# Patient Record
Sex: Female | Born: 1985 | Race: Black or African American | Hispanic: No | Marital: Single | Smoking: Never smoker
Health system: Southern US, Community
[De-identification: ages and names within clinical notes are randomized; demographics above are authoritative.]

## PROBLEM LIST (undated history)

## (undated) DIAGNOSIS — T783XXA Angioneurotic edema, initial encounter: Secondary | ICD-10-CM

## (undated) DIAGNOSIS — I10 Essential (primary) hypertension: Secondary | ICD-10-CM

## (undated) DIAGNOSIS — L509 Urticaria, unspecified: Secondary | ICD-10-CM

## (undated) DIAGNOSIS — F419 Anxiety disorder, unspecified: Secondary | ICD-10-CM

## (undated) DIAGNOSIS — T7840XA Allergy, unspecified, initial encounter: Secondary | ICD-10-CM

## (undated) DIAGNOSIS — IMO0002 Reserved for concepts with insufficient information to code with codable children: Secondary | ICD-10-CM

## (undated) DIAGNOSIS — R87619 Unspecified abnormal cytological findings in specimens from cervix uteri: Secondary | ICD-10-CM

## (undated) DIAGNOSIS — J45909 Unspecified asthma, uncomplicated: Secondary | ICD-10-CM

## (undated) DIAGNOSIS — N83209 Unspecified ovarian cyst, unspecified side: Secondary | ICD-10-CM

## (undated) DIAGNOSIS — L309 Dermatitis, unspecified: Secondary | ICD-10-CM

## (undated) HISTORY — DX: Essential (primary) hypertension: I10

## (undated) HISTORY — DX: Allergy, unspecified, initial encounter: T78.40XA

## (undated) HISTORY — DX: Unspecified asthma, uncomplicated: J45.909

## (undated) HISTORY — PX: COLPOSCOPY: SHX161

## (undated) HISTORY — DX: Unspecified abnormal cytological findings in specimens from cervix uteri: R87.619

## (undated) HISTORY — DX: Unspecified ovarian cyst, unspecified side: N83.209

## (undated) HISTORY — DX: Urticaria, unspecified: L50.9

## (undated) HISTORY — DX: Anxiety disorder, unspecified: F41.9

## (undated) HISTORY — PX: NO PAST SURGERIES: SHX2092

## (undated) HISTORY — DX: Dermatitis, unspecified: L30.9

## (undated) HISTORY — DX: Angioneurotic edema, initial encounter: T78.3XXA

## (undated) HISTORY — DX: Reserved for concepts with insufficient information to code with codable children: IMO0002

---
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
## 2005-11-26 ENCOUNTER — Other Ambulatory Visit: Admission: RE | Admit: 2005-11-26 | Discharge: 2005-11-26 | Payer: Self-pay | Admitting: *Deleted

## 2006-12-26 ENCOUNTER — Other Ambulatory Visit: Admission: RE | Admit: 2006-12-26 | Discharge: 2006-12-26 | Payer: Self-pay | Admitting: *Deleted

## 2007-01-29 ENCOUNTER — Other Ambulatory Visit: Admission: RE | Admit: 2007-01-29 | Discharge: 2007-01-29 | Payer: Self-pay | Admitting: *Deleted

## 2008-01-01 ENCOUNTER — Other Ambulatory Visit: Admission: RE | Admit: 2008-01-01 | Discharge: 2008-01-01 | Payer: Self-pay | Admitting: Gynecology

## 2011-04-25 ENCOUNTER — Ambulatory Visit (INDEPENDENT_AMBULATORY_CARE_PROVIDER_SITE_OTHER): Payer: BC Managed Care – PPO | Admitting: Family Medicine

## 2011-04-25 ENCOUNTER — Ambulatory Visit: Payer: BC Managed Care – PPO

## 2011-04-25 DIAGNOSIS — R200 Anesthesia of skin: Secondary | ICD-10-CM

## 2011-04-25 DIAGNOSIS — R42 Dizziness and giddiness: Secondary | ICD-10-CM

## 2011-04-25 DIAGNOSIS — R51 Headache: Secondary | ICD-10-CM

## 2011-04-25 DIAGNOSIS — R209 Unspecified disturbances of skin sensation: Secondary | ICD-10-CM

## 2011-04-25 DIAGNOSIS — H539 Unspecified visual disturbance: Secondary | ICD-10-CM

## 2011-04-25 DIAGNOSIS — R079 Chest pain, unspecified: Secondary | ICD-10-CM

## 2011-04-25 LAB — POCT CBC
Granulocyte percent: 70.9 % (ref 37–80)
HCT, POC: 41 % (ref 37.7–47.9)
Hemoglobin: 13.1 g/dL (ref 12.2–16.2)
Lymph, poc: 1.1 (ref 0.6–3.4)
MCH, POC: 28.5 pg (ref 27–31.2)
MCHC: 32 g/dL (ref 31.8–35.4)
MCV: 89.2 fL (ref 80–97)
MID (cbc): 0.3 (ref 0–0.9)
MPV: 9.2 fL (ref 0–99.8)
POC Granulocyte: 3.6 (ref 2–6.9)
POC LYMPH PERCENT: 22.5 % (ref 10–50)
POC MID %: 6.6 %M (ref 0–12)
Platelet Count, POC: 287 10*3/uL (ref 142–424)
RBC: 4.6 M/uL (ref 4.04–5.48)
RDW, POC: 14 %
WBC: 5.1 10*3/uL (ref 4.6–10.2)

## 2011-04-25 MED ORDER — KETOROLAC TROMETHAMINE 30 MG/ML IJ SOLN
30.0000 mg | Freq: Once | INTRAMUSCULAR | Status: AC
  Administered 2011-04-25: 30 mg via INTRAVENOUS

## 2011-04-25 NOTE — Progress Notes (Signed)
[facility]:  Office Visit  Chief Complaint:  Chief Complaint  Patient presents with  . Dizziness    since Monday  . Numbness    and stiffness- Rt side of body- since Monday  . Blurred Vision    since Monday  . Headache    since Monday    HPI: Destiny Simpson is a 26 y.o. female who complains of 3 day history of right side numbness and tingling starting from neck down, then was dizzy getting up and associated with right sided HA. Associated with blurred vision. + sharp intermittent midsternal chest pain. These sxs lasted 4 pm-7 pm and then intermittently. She comes in today because the sxs have returned. She did not take anything for these sxs since Monday since she does not "swallow pills". NO risk factors. Denies HTN, DM, XOL, premature MIs in Family hx, or aneurysms and strokes.  She denies diaphoresis, palpitations, SOB/wheezing. She is on a progesterone IUD ( mirenea)  She is a Haematologist. Does not do a lot of repetitive motion with computers, the screen is at eye level   Past Medical History  Diagnosis Date  . Ovarian cyst   . Abnormal Pap smear    Past Surgical History  Procedure Date  . Colposcopy    History   Social History  . Marital Status: Single    Spouse Name: N/A    Number of Children: N/A  . Years of Education: N/A   Social History Main Topics  . Smoking status: Never Smoker   . Smokeless tobacco: None  . Alcohol Use: No  . Drug Use: No  . Sexually Active: Yes    Birth Control/ Protection: IUD   Other Topics Concern  . None   Social History Narrative  . None   Family History  Problem Relation Age of Onset  . Hypertension Maternal Grandmother   . Diabetes Maternal Grandmother   . Cancer Maternal Grandmother   . Cancer Paternal Grandmother   . Hypertension Paternal Grandfather    No Known Allergies Prior to Admission medications   Medication Sig Start Date End Date Taking? Authorizing Provider  levonorgestrel (MIRENA)  20 MCG/24HR IUD 1 each by Intrauterine route once.   Yes Historical Provider, MD     ROS: The patient denies fevers, chills, night sweats, unintentional weight loss, palpitations, wheezing, dyspnea on exertion, nausea, vomiting, abdominal pain, dysuria, hematuria, melena, numbness, weakness, or tingling.+ chest pain, dizziness, numbness/tingling, headaches All other systems have been reviewed and were otherwise negative with the exception of those mentioned in the HPI and as above.    PHYSICAL EXAM: Filed Vitals:   04/25/11 1902  BP: 111/74  Pulse: 87  Temp: 99.2 F (37.3 C)  Resp: 16   Filed Vitals:   04/25/11 1902  Height: 5' 1.5" (1.562 m)  Weight: 111 lb (50.349 kg)   Body mass index is 20.63 kg/(m^2).  General: Alert, no acute distress HEENT:  Normocephalic, atraumatic, oropharynx patent. EIMI, PERRLA, fundoscopic exam normal Cardiovascular:  Regular rate and rhythm, no rubs murmurs or gallops.  No Carotid bruits, radial pulse intact. No pedal edema.  Respiratory: Clear to auscultation bilaterally.  No wheezes, rales, or rhonchi.  No cyanosis, no use of accessory musculature GI: No organomegaly, abdomen is soft and non-tender, positive bowel sounds.  No masses. Skin: No rashes. Neurologic: Facial musculature symmetric.CN 2-12 grossly intact. 5/5 strength, Sensation intact. Cerebelllar fxn intact, romberg nl, DTR nl 2/2  Psychiatric: Patient is appropriate  throughout our interaction. Lymphatic: No cervical lymphadenopathy Musculoskeletal: Gait intact. C-spine: no tenderness, full AROM, PROM, 5/5 strangth, + spurling to the right   LABS: Results for orders placed in visit on 04/25/11  POCT CBC      Component Value Range   WBC 5.1  4.6 - 10.2 (K/uL)   Lymph, poc 1.1  0.6 - 3.4    POC LYMPH PERCENT 22.5  10 - 50 (%L)   MID (cbc) 0.3  0 - 0.9    POC MID % 6.6  0 - 12 (%M)   POC Granulocyte 3.6  2 - 6.9    Granulocyte percent 70.9  37 - 80 (%G)   RBC 4.60  4.04 - 5.48  (M/uL)   Hemoglobin 13.1  12.2 - 16.2 (g/dL)   HCT, POC 16.1  09.6 - 47.9 (%)   MCV 89.2  80 - 97 (fL)   MCH, POC 28.5  27 - 31.2 (pg)   MCHC 32.0  31.8 - 35.4 (g/dL)   RDW, POC 04.5     Platelet Count, POC 287  142 - 424 (K/uL)   MPV 9.2  0 - 99.8 (fL)     EKG/XRAY:   Primary read interpreted by Dr. Conley Rolls at [facility].  1. NSR at 74 bpm. NO ST/T wave changes 2. C-spine is normal. NO dislocation, fractures, or stenosis appreciated 3. Vision is  R eye with 20/40, L 20/20   ASSESSMENT/PLAN: Encounter Diagnoses  Name Primary?  . Chest pain Yes  . Numbness and tingling of right arm and leg   . Vision changes    Most likely an atypical migraine vs ? c-spine radicular pain since reproducible. However patient denies any history of migraine or h/o neck issues prior to this sudden onset. Will await other labs. Toradol 30 mg IM given in office sicne patient can't swallow pills to help with HA. Will get CT head without contrast to rule out CVA, mass, etc.    Brendi Mccarroll PHUONG, DO 04/25/2011 8:35 PM

## 2011-04-26 LAB — COMPREHENSIVE METABOLIC PANEL WITH GFR
Albumin: 4.4 g/dL (ref 3.5–5.2)
Alkaline Phosphatase: 66 U/L (ref 39–117)
BUN: 15 mg/dL (ref 6–23)
CO2: 27 meq/L (ref 19–32)
Calcium: 9.2 mg/dL (ref 8.4–10.5)
Chloride: 105 meq/L (ref 96–112)
Glucose, Bld: 87 mg/dL (ref 70–99)
Potassium: 4.2 meq/L (ref 3.5–5.3)
Sodium: 140 meq/L (ref 135–145)
Total Protein: 6.8 g/dL (ref 6.0–8.3)

## 2011-04-26 LAB — COMPREHENSIVE METABOLIC PANEL
ALT: 8 U/L (ref 0–35)
AST: 13 U/L (ref 0–37)
Creat: 0.81 mg/dL (ref 0.50–1.10)
Total Bilirubin: 0.6 mg/dL (ref 0.3–1.2)

## 2011-04-26 LAB — TSH: TSH: 1.035 u[IU]/mL (ref 0.350–4.500)

## 2011-04-27 ENCOUNTER — Ambulatory Visit (INDEPENDENT_AMBULATORY_CARE_PROVIDER_SITE_OTHER): Payer: BC Managed Care – PPO | Admitting: Internal Medicine

## 2011-04-27 VITALS — BP 96/63 | HR 96 | Temp 99.3°F | Resp 18 | Ht 61.0 in | Wt 111.4 lb

## 2011-04-27 DIAGNOSIS — T782XXA Anaphylactic shock, unspecified, initial encounter: Secondary | ICD-10-CM

## 2011-04-27 DIAGNOSIS — L509 Urticaria, unspecified: Secondary | ICD-10-CM

## 2011-04-27 DIAGNOSIS — T7840XA Allergy, unspecified, initial encounter: Secondary | ICD-10-CM

## 2011-04-27 MED ORDER — PREDNISOLONE 15 MG/5ML PO SYRP: 1.0000 mg/kg | ORAL_SOLUTION | Freq: Every day | ORAL | Status: DC

## 2011-04-27 MED ORDER — DIPHENHYDRAMINE HCL 12.5 MG/5ML PO SYRP: 50.0000 mg | ORAL_SOLUTION | Freq: Four times a day (QID) | ORAL | Status: AC | PRN

## 2011-04-27 NOTE — Progress Notes (Signed)
  Subjective:    Patient ID: Destiny Simpson, female    DOB: 01/26/1986, 26 y.o.   MRN: [id number]  Urticaria   Hives Onset today at 2:30 No difficulty breathing or swallowing No sob After she took a dose of peto bismol No know allergy to aspirin   Review of Systems  Unable to perform ROS Constitutional: Negative.   HENT: Negative.   Eyes: Negative.   Respiratory: Negative.   Cardiovascular: Negative.   Gastrointestinal: Negative.   Genitourinary: Negative.   Skin: Positive for rash.  Neurological: Negative.   Hematological: Negative.   Psychiatric/Behavioral: Negative.   All other systems reviewed and are negative.       Objective:   Physical Exam  Nursing note and vitals reviewed. Constitutional: She is oriented to person, place, and time. She appears well-developed and well-nourished.  HENT:  Head: Normocephalic and atraumatic.  Right Ear: External ear normal.  Left Ear: External ear normal.  Eyes: Conjunctivae and EOM are normal. Pupils are equal, round, and reactive to light.  Neck: Normal range of motion. Neck supple.  Cardiovascular: Normal rate, regular rhythm and normal heart sounds.   Pulmonary/Chest: Effort normal and breath sounds normal.  Abdominal: Soft. Bowel sounds are normal.  Musculoskeletal: Normal range of motion.  Neurological: She is alert and oriented to person, place, and time. She has normal reflexes.  Skin: Rash noted.       Hives on upper extrems  Psychiatric: She has a normal mood and affect. Her behavior is normal. Judgment and thought content normal.          Assessment & Plan:  Allergic reaction. Antihistamines prednisone avoidance of allergin

## 2011-04-27 NOTE — Patient Instructions (Signed)
Benadryl 50 mg every 6 hours fo r 3 days.prelone as directed daily for 3 days. No pepto bismol or aspirin as you may be allergic to this. If your symptoms worsen or if you develop difficulty swallowing or breathing return to the er.

## 2011-08-07 ENCOUNTER — Ambulatory Visit (INDEPENDENT_AMBULATORY_CARE_PROVIDER_SITE_OTHER): Payer: BC Managed Care – PPO | Admitting: Family Medicine

## 2011-08-07 VITALS — BP 110/77 | HR 61 | Temp 98.5°F | Resp 16 | Ht 60.25 in | Wt 115.0 lb

## 2011-08-07 DIAGNOSIS — L559 Sunburn, unspecified: Secondary | ICD-10-CM

## 2011-08-07 DIAGNOSIS — L259 Unspecified contact dermatitis, unspecified cause: Secondary | ICD-10-CM

## 2011-08-07 DIAGNOSIS — Z888 Allergy status to other drugs, medicaments and biological substances status: Secondary | ICD-10-CM

## 2011-08-07 DIAGNOSIS — L309 Dermatitis, unspecified: Secondary | ICD-10-CM

## 2011-08-07 DIAGNOSIS — L509 Urticaria, unspecified: Secondary | ICD-10-CM

## 2011-08-07 DIAGNOSIS — T7840XA Allergy, unspecified, initial encounter: Secondary | ICD-10-CM

## 2011-08-07 MED ORDER — PREDNISOLONE 15 MG/5ML PO SYRP: 1.0000 mg/kg | ORAL_SOLUTION | Freq: Every day | ORAL | Status: AC

## 2011-08-07 MED ORDER — SILVER SULFADIAZINE 1 % EX CREA: TOPICAL_CREAM | Freq: Every day | CUTANEOUS | Status: DC

## 2011-08-07 NOTE — Progress Notes (Signed)
  Subjective:    Patient ID: Destiny Simpson, female    DOB: [birth year], 26 y.o.   MRN: [id number]  HPI Sunburn and eczema flare x 4 days.  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  Has had worsening skin tenderness  Baseline hx/o eczema.  Has had flare of this over the same time period.  Gwenith Daily been using topical lotions and OTC hydrocortisone cream with minimal improvement in sxs.    Review of Systems See HPI, otherwise ROS negative     Objective:   Physical Exam  Constitutional: She appears well-developed and well-nourished.  HENT:  Head: Normocephalic.  Right Ear: External ear normal.  Eyes: Pupils are equal, round, and reactive to light.  Neck: Normal range of motion. Neck supple.  Cardiovascular: Normal rate and regular rhythm.   Pulmonary/Chest: Effort normal and breath sounds normal.  Skin:          + hyperpigmentation and peeling on shoulders bilaterally.           Assessment & Plan:  Treat sunburn with silvadene Treat eczema flare with prednisone.  Discussed general red flags and skin care/sun avoidance.  Handout given.     The patient and/or caregiver has been counseled thoroughly with regard to treatment plan and/or medications prescribed including dosage, schedule, interactions, rationale for use, and possible side effects and they verbalize understanding. Diagnoses and expected course of recovery discussed and will return if not improved as expected or if the condition worsens. Patient and/or caregiver verbalized understanding.

## 2011-08-07 NOTE — Patient Instructions (Signed)
Sunburn Sunburn is damage to the skin caused by overexposure to ultraviolet (UV) rays. People with light skin or a fair complexion may be more susceptible to sunburn. Repeated sun exposure causes early skin aging such as wrinkles and sun spots. It also increases the risk of skin cancer. CAUSES A sunburn is caused by getting too much UV radiation from the sun. SYMPTOMS  Red or pink skin.   Soreness and swelling.   Pain.   Blisters.   Peeling skin.   Headache, fever, and fatigue if sunburn covers a large area.  TREATMENT  Your caregiver may tell you to take certain medicines to lessen inflammation.   Your caregiver may have you use hydrocortisone cream or spray to help with itching and inflammation.   Your caregiver may prescribe an antibiotic cream to use on blisters.  HOME CARE INSTRUCTIONS   Avoid further exposure to the sun.   Cool baths and cool compresses may be helpful if used several times per day. Do not apply ice, since this may result in more damage to the skin.   Only take over-the-counter or prescription medicines for pain, discomfort, or fever as directed by your caregiver.   Use aloe or other over-the-counter sunburn creams or gels on your skin. Do not apply these creams or gels on blisters.   Drink enough fluids to keep your urine clear or pale yellow.   Do not break blisters. If blisters break, your caregiver may recommend an antibiotic cream to apply to the affected area.  PREVENTION   Try to avoid the sun between 10:00 a.m. and 4:00 p.m. when it is the strongest.   Use a sunscreen or sunblock with SPF 30 or greater.   Apply sunscreen at least 30 minutes before exposure to the sun.   Always wear protective hats, clothing, and sunglasses with UV protection.   Avoid medicines, herbs, and foods that increase your sensitivity to sunlight.   Avoid tanning beds.  SEEK IMMEDIATE MEDICAL CARE IF:   You have a fever.   Your pain is uncontrolled with  medicine.   You start to vomit or have diarrhea.   You feel faint or develop a headache with confusion.   You develop severe blistering.   You have a pus-like (purulent) discharge coming from the blisters.   Your burn becomes more painful and swollen.  MAKE SURE YOU:  Understand these instructions.   Will watch your condition.   Will get help right away if you are not doing well or get worse.  Document Released: 11/08/2004 Document Revised: 01/18/2011 Document Reviewed: 07/23/2010 [facility] Patient Information 2012 [place], [place]. Eczema Atopic dermatitis, or eczema, is an inherited type of sensitive skin. Often people with eczema have a family history of allergies, asthma, or hay fever. It causes a red itchy rash and dry scaly skin. The itchiness may occur before the skin rash and may be very intense. It is not contagious. Eczema is generally worse during the cooler winter months and often improves with the warmth of summer. Eczema usually starts showing signs in infancy. Some children outgrow eczema, but it may last through adulthood. Flare-ups may be caused by:  Eating something or contact with something you are sensitive or allergic to.   Stress.  DIAGNOSIS  The diagnosis of eczema is usually based upon symptoms and medical history. TREATMENT  Eczema cannot be cured, but symptoms usually can be controlled with treatment or avoidance of allergens (things to which you are sensitive or allergic to).  Controlling the itching and scratching.   Use over-the-counter antihistamines as directed for itching. It is especially useful at night when the itching tends to be worse.   Use over-the-counter steroid creams as directed for itching.   Scratching makes the rash and itching worse and may cause impetigo (a skin infection) if fingernails are contaminated (dirty).   Keeping the skin well moisturized with creams every day. This will seal in moisture and help prevent dryness. Lotions  containing alcohol and water can dry the skin and are not recommended.   Limiting exposure to allergens.   Recognizing situations that cause stress.   Developing a plan to manage stress.  HOME CARE INSTRUCTIONS   Take prescription and over-the-counter medicines as directed by your caregiver.   Do not use anything on the skin without checking with your caregiver.   Keep baths or showers short (5 minutes) in warm (not hot) water. Use mild cleansers for bathing. You may add non-perfumed bath oil to the bath water. It is best to avoid soap and bubble bath.   Immediately after a bath or shower, when the skin is still damp, apply a moisturizing ointment to the entire body. This ointment should be a petroleum ointment. This will seal in moisture and help prevent dryness. The thicker the ointment the better. These should be unscented.   Keep fingernails cut short and wash hands often. If your child has eczema, it may be necessary to put soft gloves or mittens on your child at night.   Dress in clothes made of cotton or cotton blends. Dress lightly, as heat increases itching.   Avoid foods that may cause flare-ups. Common foods include cow's milk, peanut butter, eggs and wheat.   Keep a child with eczema away from anyone with fever blisters. The virus that causes fever blisters (herpes simplex) can cause a serious skin infection in children with eczema.  SEEK MEDICAL CARE IF:   Itching interferes with sleep.   The rash gets worse or is not better within one week following treatment.   The rash looks infected (pus or soft yellow scabs).   You or your child has an oral temperature above 102 F (38.9 C).   Your baby is older than 3 months with a rectal temperature of 100.5 F (38.1 C) or higher for more than 1 day.   The rash flares up after contact with someone who has fever blisters.  SEEK IMMEDIATE MEDICAL CARE IF:   Your baby is older than 3 months with a rectal temperature of 102 F  (38.9 C) or higher.   Your baby is older than 3 months or younger with a rectal temperature of 100.4 F (38 C) or higher.  Document Released: 01/27/2000 Document Revised: 01/18/2011 Document Reviewed: 12/01/2008 [facility] Patient Information 2012 [place], [place].

## 2011-11-03 ENCOUNTER — Encounter: Payer: Self-pay | Admitting: Family Medicine

## 2011-11-03 ENCOUNTER — Ambulatory Visit (INDEPENDENT_AMBULATORY_CARE_PROVIDER_SITE_OTHER): Payer: BC Managed Care – PPO | Admitting: Family Medicine

## 2011-11-03 VITALS — BP 100/70 | HR 90 | Temp 98.8°F | Resp 16 | Ht 62.0 in | Wt 116.8 lb

## 2011-11-03 DIAGNOSIS — R339 Retention of urine, unspecified: Secondary | ICD-10-CM

## 2011-11-03 DIAGNOSIS — N390 Urinary tract infection, site not specified: Secondary | ICD-10-CM

## 2011-11-03 DIAGNOSIS — L309 Dermatitis, unspecified: Secondary | ICD-10-CM

## 2011-11-03 DIAGNOSIS — R300 Dysuria: Secondary | ICD-10-CM

## 2011-11-03 LAB — POCT URINALYSIS DIPSTICK
Bilirubin, UA: NEGATIVE
Glucose, UA: NEGATIVE
Leukocytes, UA: NEGATIVE
Nitrite, UA: NEGATIVE
Urobilinogen, UA: 0.2

## 2011-11-03 LAB — POCT UA - MICROSCOPIC ONLY: Crystals, Ur, HPF, POC: NEGATIVE

## 2011-11-03 MED ORDER — BETAMETHASONE DIPROPIONATE 0.05 % EX CREA: TOPICAL_CREAM | Freq: Two times a day (BID) | CUTANEOUS | Status: DC

## 2011-11-03 MED ORDER — CEPHALEXIN 250 MG/5ML PO SUSR: 250.0000 mg | Freq: Three times a day (TID) | ORAL | Status: DC

## 2011-11-03 NOTE — Progress Notes (Signed)
@[email or web address] @   Patient ID: Destiny Simpson MRN: [id number], DOB: October 15, 1985, 26 y.o. Date of Encounter: 11/03/2011, 10:16 AM  Primary Physician: No primary provider on file.  Chief Complaint:  Chief Complaint  Patient presents with  . Rash    on right arm and back of legs  . cannot hardly go urinate    barely comes out    HPI: 26 y.o. year old female presents with 5 day history of dysuria, urgency, and frequency. Last UTI was July No hematuria LMP:  No sick contacts, recent antibiotics, or recent travels.   No vaginal discharge, back pain, fever  Past Medical History  Diagnosis Date  . Ovarian cyst   . Abnormal Pap smear      Home Meds: Prior to Admission medications   Medication Sig Start Date End Date Taking? Authorizing Provider  levonorgestrel (MIRENA) 20 MCG/24HR IUD 1 each by Intrauterine route once.   Yes Historical Provider, MD  silver sulfADIAZINE (SILVADENE) 1 % cream Apply topically daily. Apply to shoulders untils symptoms resolve 08/07/11 08/06/12  Doree Albee, MD    Allergies: No Known Allergies  History   Social History  . Marital Status: Single    Spouse Name: N/A    Number of Children: N/A  . Years of Education: N/A   Occupational History  . Not on file.   Social History Main Topics  . Smoking status: Never Smoker   . Smokeless tobacco: Not on file  . Alcohol Use: No  . Drug Use: No  . Sexually Active: Yes    Birth Control/ Protection: IUD   Other Topics Concern  . Not on file   Social History Narrative  . No narrative on file     Review of Systems: Constitutional: negative for chills, fever, night sweats or weight changes Cardiovascular: negative for chest pain or palpitations Respiratory: negative for hemoptysis, wheezing, or shortness of breath Abdominal: negative for abdominal pain, nausea, vomiting or diarrhea Dermatological: negative for rash Neurologic: negative for headache   Physical Exam: Blood pressure 100/70, pulse  90, temperature 98.8 F (37.1 C), temperature source Oral, resp. rate 16, height 5\' 2"  (1.575 m), weight 116 lb 12.8 oz (52.98 kg), SpO2 99.00%., Body mass index is 21.36 kg/(m^2). General: Well developed, well nourished, in no acute distress. Head: Normocephalic, atraumatic, eyes without discharge, sclera non-icteric, nares are congested. Bilateral auditory canals clear, TM's are without perforation, pearly grey with reflective cone of light bilaterally. Serous effusion bilaterally behind TM's. Maxillary sinus TTP. Oral cavity moist, dentition normal. Posterior pharynx with post nasal drip and mild erythema. No peritonsillar abscess or tonsillar exudate. Neck: Supple. No thyromegaly. Full ROM. No lymphadenopathy. Lungs: Coarse breath sounds bilaterally without Clear bilaterally to auscultation without wheezes, rales, or rhonchi. Breathing is unlabored.  Heart: RRR with S1 S2. No murmurs, rubs, or gallops appreciated. Abdomen: Soft, non-tender, non-distended with normoactive bowel sounds. No hepatosplenomegaly. No rebound/guarding. No obvious abdominal masses. McBurney's, Rovsing's, Iliopsoas, and table jar all negative. Msk:  Strength and tone normal for age. Extremities: No clubbing or cyanosis. No edema. Neuro: Alert and oriented X 3. Moves all extremities spontaneously. CNII-XII grossly in tact. Psych:  Responds to questions appropriately with a normal affect.   Labs: Results for orders placed in visit on 11/03/11  POCT URINALYSIS DIPSTICK      Component Value Range   Color, UA yellow     Clarity, UA clear     Glucose, UA neg     Bilirubin, UA neg  Ketones, UA neg     Spec Grav, UA >=1.030     Blood, UA trace     pH, UA 5.5     Protein, UA neg     Urobilinogen, UA 0.2     Nitrite, UA neg     Leukocytes, UA Negative    POCT UA - MICROSCOPIC ONLY      Component Value Range   WBC, Ur, HPF, POC 1-3     RBC, urine, microscopic 1-3     Bacteria, U Microscopic tr     Mucus, UA tr       Epithelial cells, urine per micros 3-5     Crystals, Ur, HPF, POC neg     Casts, Ur, LPF, POC neg     Yeast, UA neg        ASSESSMENT AND PLAN:  26 y.o. year old female with  - -Mucinex -Tylenol/Motrin prn -Rest/fluids -RTC precautions -RTC 3-5 days if no improvement  Also, patient  Has a right antecubital rash not responding to her cortisone or neosporin creams.  Also has a rash behind the right knee.  H/O eczema  Objective:  Eczematous rashes behind right knee and involving the right antecubital area  Assessment:  Eczema Plan:  Stop the neosporin  Signed, Elvina Sidle, MD 11/03/2011 10:16 AM

## 2011-11-03 NOTE — Patient Instructions (Addendum)
Urinary Tract Infection Infections of the urinary tract can start in several places. A bladder infection (cystitis), a kidney infection (pyelonephritis), and a prostate infection (prostatitis) are different types of urinary tract infections (UTIs). They usually get better if treated with medicines (antibiotics) that kill germs. Take all the medicine until it is gone. You or your child may feel better in a few days, but TAKE ALL MEDICINE or the infection may not respond and may become more difficult to treat. HOME CARE INSTRUCTIONS   Drink enough water and fluids to keep the urine clear or pale yellow. Cranberry juice is especially recommended, in addition to large amounts of water.   Avoid caffeine, tea, and carbonated beverages. They tend to irritate the bladder.   Alcohol may irritate the prostate.   Only take over-the-counter or prescription medicines for pain, discomfort, or fever as directed by your caregiver.  To prevent further infections:  Empty the bladder often. Avoid holding urine for long periods of time.   After a bowel movement, women should cleanse from front to back. Use each tissue only once.   Empty the bladder before and after sexual intercourse.  FINDING OUT THE RESULTS OF YOUR TEST Not all test results are available during your visit. If your or your child's test results are not back during the visit, make an appointment with your caregiver to find out the results. Do not assume everything is normal if you have not heard from your caregiver or the medical facility. It is important for you to follow up on all test results. SEEK MEDICAL CARE IF:   There is back pain.   Your baby is older than 3 months with a rectal temperature of 100.5 F (38.1 C) or higher for more than 1 day.   Your or your child's problems (symptoms) are no better in 3 days. Return sooner if you or your child is getting worse.  SEEK IMMEDIATE MEDICAL CARE IF:   There is severe back pain or lower  abdominal pain.   You or your child develops chills.   You have a fever.   Your baby is older than 3 months with a rectal temperature of 102 F (38.9 C) or higher.   Your baby is 23 months old or younger with a rectal temperature of 100.4 F (38 C) or higher.   There is nausea or vomiting.   There is continued burning or discomfort with urination.  MAKE SURE YOU:   Understand these instructions.   Will watch your condition.   Will get help right away if you are not doing well or get worse.  Document Released: 11/08/2004 Document Revised: 01/18/2011 Document Reviewed: 06/13/2006 [facility] Patient Information 2012 [place], [place].Eczema Atopic dermatitis, or eczema, is an inherited type of sensitive skin. Often people with eczema have a family history of allergies, asthma, or hay fever. It causes a red itchy rash and dry scaly skin. The itchiness may occur before the skin rash and may be very intense. It is not contagious. Eczema is generally worse during the cooler winter months and often improves with the warmth of summer. Eczema usually starts showing signs in infancy. Some children outgrow eczema, but it may last through adulthood. Flare-ups may be caused by:  Eating something or contact with something you are sensitive or allergic to.   Stress.  DIAGNOSIS  The diagnosis of eczema is usually based upon symptoms and medical history. TREATMENT  Eczema cannot be cured, but symptoms usually can be controlled with treatment  or avoidance of allergens (things to which you are sensitive or allergic to).  Controlling the itching and scratching.   Use over-the-counter antihistamines as directed for itching. It is especially useful at night when the itching tends to be worse.   Use over-the-counter steroid creams as directed for itching.   Scratching makes the rash and itching worse and may cause impetigo (a skin infection) if fingernails are contaminated (dirty).   Keeping the skin  well moisturized with creams every day. This will seal in moisture and help prevent dryness. Lotions containing alcohol and water can dry the skin and are not recommended.   Limiting exposure to allergens.   Recognizing situations that cause stress.   Developing a plan to manage stress.  HOME CARE INSTRUCTIONS   Take prescription and over-the-counter medicines as directed by your caregiver.   Do not use anything on the skin without checking with your caregiver.   Keep baths or showers short (5 minutes) in warm (not hot) water. Use mild cleansers for bathing. You may add non-perfumed bath oil to the bath water. It is best to avoid soap and bubble bath.   Immediately after a bath or shower, when the skin is still damp, apply a moisturizing ointment to the entire body. This ointment should be a petroleum ointment. This will seal in moisture and help prevent dryness. The thicker the ointment the better. These should be unscented.   Keep fingernails cut short and wash hands often. If your child has eczema, it may be necessary to put soft gloves or mittens on your child at night.   Dress in clothes made of cotton or cotton blends. Dress lightly, as heat increases itching.   Avoid foods that may cause flare-ups. Common foods include cow's milk, peanut butter, eggs and wheat.   Keep a child with eczema away from anyone with fever blisters. The virus that causes fever blisters (herpes simplex) can cause a serious skin infection in children with eczema.  SEEK MEDICAL CARE IF:   Itching interferes with sleep.   The rash gets worse or is not better within one week following treatment.   The rash looks infected (pus or soft yellow scabs).   You or your child has an oral temperature above 102 F (38.9 C).   Your baby is older than 3 months with a rectal temperature of 100.5 F (38.1 C) or higher for more than 1 day.   The rash flares up after contact with someone who has fever blisters.    SEEK IMMEDIATE MEDICAL CARE IF:   Your baby is older than 3 months with a rectal temperature of 102 F (38.9 C) or higher.   Your baby is older than 3 months or younger with a rectal temperature of 100.4 F (38 C) or higher.  Document Released: 01/27/2000 Document Revised: 01/18/2011 Document Reviewed: 12/01/2008 [facility] Patient Information 2012 [place], [place].

## 2011-11-04 LAB — URINE CULTURE
Colony Count: NO GROWTH
Organism ID, Bacteria: NO GROWTH

## 2012-01-05 ENCOUNTER — Ambulatory Visit (INDEPENDENT_AMBULATORY_CARE_PROVIDER_SITE_OTHER): Payer: BC Managed Care – PPO | Admitting: Family Medicine

## 2012-01-05 VITALS — BP 115/73 | HR 64 | Temp 97.9°F | Resp 16 | Ht 61.58 in | Wt 120.4 lb

## 2012-01-05 VITALS — BP 100/60 | HR 60 | Temp 98.0°F | Resp 16 | Ht 61.5 in | Wt 119.0 lb

## 2012-01-05 DIAGNOSIS — L509 Urticaria, unspecified: Secondary | ICD-10-CM

## 2012-01-05 DIAGNOSIS — K5900 Constipation, unspecified: Secondary | ICD-10-CM

## 2012-01-05 DIAGNOSIS — M545 Low back pain, unspecified: Secondary | ICD-10-CM

## 2012-01-05 DIAGNOSIS — R160 Hepatomegaly, not elsewhere classified: Secondary | ICD-10-CM

## 2012-01-05 DIAGNOSIS — R300 Dysuria: Secondary | ICD-10-CM

## 2012-01-05 DIAGNOSIS — N390 Urinary tract infection, site not specified: Secondary | ICD-10-CM

## 2012-01-05 DIAGNOSIS — R109 Unspecified abdominal pain: Secondary | ICD-10-CM

## 2012-01-05 LAB — POCT URINALYSIS DIPSTICK
Glucose, UA: NEGATIVE
Nitrite, UA: NEGATIVE
Urobilinogen, UA: 0.2

## 2012-01-05 LAB — POCT UA - MICROSCOPIC ONLY
Casts, Ur, LPF, POC: NEGATIVE
Yeast, UA: NEGATIVE

## 2012-01-05 MED ORDER — CEFTRIAXONE SODIUM 1 G IJ SOLR
1.0000 g | Freq: Once | INTRAMUSCULAR | Status: AC
  Administered 2012-01-05: 1 g via INTRAMUSCULAR

## 2012-01-05 MED ORDER — KETOROLAC TROMETHAMINE 60 MG/2ML IM SOLN
60.0000 mg | Freq: Once | INTRAMUSCULAR | Status: AC
  Administered 2012-01-05: 60 mg via INTRAMUSCULAR

## 2012-01-05 MED ORDER — CETIRIZINE HCL 5 MG/5ML PO SYRP: 10.0000 mg | ORAL_SOLUTION | Freq: Every day | ORAL | Status: DC

## 2012-01-05 MED ORDER — CEPHALEXIN 250 MG/5ML PO SUSR: 500.0000 mg | Freq: Two times a day (BID) | ORAL | Status: AC

## 2012-01-05 NOTE — Progress Notes (Deleted)
  Subjective:    Patient ID: Destiny Simpson, female    DOB: [birth year], 26 y.o.   MRN: [id number]  HPI    Review of Systems     Objective:   Physical Exam        Assessment & Plan:

## 2012-01-05 NOTE — Patient Instructions (Signed)
Start a daily fiber supplement like benefiber or metamucil.  If you still have constipation add in miralax (polyethylene glycol 3350) once to twice a day and/or docusate (colace) 100mg  twice a day.  Constipation, Adult Constipation is when a person has fewer than 3 bowel movements a week; has difficulty having a bowel movement; or has stools that are dry, hard, or larger than normal. As people grow older, constipation is more common. If you try to fix constipation with medicines that make you have a bowel movement (laxatives), the problem may get worse. Long-term laxative use may cause the muscles of the colon to become weak. A low-fiber diet, not taking in enough fluids, and taking certain medicines may make constipation worse. CAUSES   Certain medicines, such as antidepressants, pain medicine, iron supplements, antacids, and water pills.   Certain diseases, such as diabetes, irritable bowel syndrome (IBS), thyroid disease, or depression.   Not drinking enough water.   Not eating enough fiber-rich foods.   Stress or travel.  Lack of physical activity or exercise.  Not going to the restroom when there is the urge to have a bowel movement.  Ignoring the urge to have a bowel movement.  Using laxatives too much. SYMPTOMS   Having fewer than 3 bowel movements a week.   Straining to have a bowel movement.   Having hard, dry, or larger than normal stools.   Feeling full or bloated.   Pain in the lower abdomen.  Not feeling relief after having a bowel movement. DIAGNOSIS  Your caregiver will take a medical history and perform a physical exam. Further testing may be done for severe constipation. Some tests may include:   A barium enema X-ray to examine your rectum, colon, and sometimes, your small intestine.  A sigmoidoscopy to examine your lower colon.  A colonoscopy to examine your entire colon. TREATMENT  Treatment will depend on the severity of your constipation and  what is causing it. Some dietary treatments include drinking more fluids and eating more fiber-rich foods. Lifestyle treatments may include regular exercise. If these diet and lifestyle recommendations do not help, your caregiver may recommend taking over-the-counter laxative medicines to help you have bowel movements. Prescription medicines may be prescribed if over-the-counter medicines do not work.  HOME CARE INSTRUCTIONS   Increase dietary fiber in your diet, such as fruits, vegetables, whole grains, and beans. Limit high-fat and processed sugars in your diet, such as [place] fries, hamburgers, cookies, candies, and soda.   A fiber supplement may be added to your diet if you cannot get enough fiber from foods.   Drink enough fluids to keep your urine clear or pale yellow.   Exercise regularly or as directed by your caregiver.   Go to the restroom when you have the urge to go. Do not hold it.  Only take medicines as directed by your caregiver. Do not take other medicines for constipation without talking to your caregiver first. SEEK IMMEDIATE MEDICAL CARE IF:   You have bright red blood in your stool.   Your constipation lasts for more than 4 days or gets worse.   You have abdominal or rectal pain.   You have thin, pencil-like stools.  You have unexplained weight loss. MAKE SURE YOU:   Understand these instructions.  Will watch your condition.  Will get help right away if you are not doing well or get worse. Document Released: 10/28/2003 Document Revised: 04/23/2011 Document Reviewed: 01/02/2011 [facility] Patient Information 2013 [place], [place].

## 2012-01-05 NOTE — Progress Notes (Signed)
Subjective:    Patient ID: Destiny Simpson, female    DOB: 05-Apr-1985, 26 y.o.   MRN: [id number] Chief Complaint  Patient presents with  . Urinary Tract Infection    x 1.5 days   burning,  lbp, abd pain, frequency, retention    HPI  Sxs started 2d ago and low back pain yesterday. Can't swallow pills.  Past Medical History  Diagnosis Date  . Ovarian cyst   . Abnormal Pap smear   . Allergy   . Asthma    Current Outpatient Prescriptions on File Prior to Visit  Medication Sig Dispense Refill  . levonorgestrel (MIRENA) 20 MCG/24HR IUD 1 each by Intrauterine route once.       No current facility-administered medications on file prior to visit.   No Known Allergies   Review of Systems  Constitutional: Positive for chills, diaphoresis and activity change. Negative for fever, appetite change, fatigue and unexpected weight change.  Gastrointestinal: Positive for nausea and abdominal pain. Negative for diarrhea, constipation, blood in stool, anal bleeding and rectal pain.  Genitourinary: Positive for dysuria, urgency, frequency, flank pain, decreased urine volume and difficulty urinating. Negative for hematuria, vaginal bleeding, vaginal discharge, genital sores, vaginal pain, menstrual problem and pelvic pain.  Musculoskeletal: Positive for back pain. Negative for gait problem.  Skin: Negative for rash.  Hematological: Negative for adenopathy.  Psychiatric/Behavioral: The patient is not nervous/anxious.       BP 115/73  Pulse 64  Temp(Src) 97.9 F (36.6 C) (Oral)  Resp 16  Ht 5' 1.58" (1.564 m)  Wt 120 lb 6.4 oz (54.613 kg)  BMI 22.33 kg/m2  SpO2 100% Objective:   Physical Exam  Constitutional: She is oriented to person, place, and time. She appears well-developed and well-nourished. No distress.  HENT:  Head: Normocephalic and atraumatic.  Cardiovascular: Normal rate, regular rhythm, normal heart sounds and intact distal pulses.   Pulmonary/Chest: Effort normal and breath  sounds normal.  Abdominal: Soft. Bowel sounds are normal. She exhibits no distension. There is no hepatosplenomegaly. There is tenderness in the right lower quadrant, periumbilical area, suprapubic area and left lower quadrant. There is no rebound, no guarding and no CVA tenderness.  Neurological: She is alert and oriented to person, place, and time.  Skin: Skin is warm and dry. She is not diaphoretic.  Psychiatric: She has a normal mood and affect. Her behavior is normal.       Results for orders placed in visit on 01/05/12  URINE CULTURE      Result Value Range   Culture ESCHERICHIA COLI     Colony Count >=100,000 COLONIES/ML     Organism ID, Bacteria ESCHERICHIA COLI    POCT UA - MICROSCOPIC ONLY      Result Value Range   WBC, Ur, HPF, POC TNTC     RBC, urine, microscopic 10-15     Bacteria, U Microscopic 1+     Mucus, UA trace     Epithelial cells, urine per micros 3-6     Crystals, Ur, HPF, POC neg     Casts, Ur, LPF, POC neg     Yeast, UA neg    POCT URINALYSIS DIPSTICK      Result Value Range   Color, UA yellow     Clarity, UA cloudy     Glucose, UA neg     Bilirubin, UA neg     Ketones, UA neg     Spec Grav, UA >=1.030     Blood, UA  large     pH, UA 5.5     Protein, UA 100     Urobilinogen, UA 0.2     Nitrite, UA neg     Leukocytes, UA moderate (2+)      Assessment & Plan:  Lower back pain - Plan: POCT UA - Microscopic Only, POCT urinalysis dipstick  Dysuria - Plan: POCT UA - Microscopic Only, POCT urinalysis dipstick, cephALEXin (KEFLEX) 250 MG/5ML suspension  Abdominal pain - Plan: POCT UA - Microscopic Only, POCT urinalysis dipstick  Urinary tract infection - Plan: Urine culture, cephALEXin (KEFLEX) 250 MG/5ML suspension, cefTRIAXone (ROCEPHIN) injection 1 g, ketorolac (TORADOL) injection 60 mg  Constipation  Meds ordered this encounter  Medications  . cephALEXin (KEFLEX) 250 MG/5ML suspension    Sig: Take 10 mLs (500 mg total) by mouth 2 (two) times  daily.    Dispense:  150 mL    Refill:  0  . cefTRIAXone (ROCEPHIN) injection 1 g    Sig:   . ketorolac (TORADOL) injection 60 mg    Sig:

## 2012-01-08 LAB — URINE CULTURE: Colony Count: >=100000

## 2012-04-28 ENCOUNTER — Ambulatory Visit (INDEPENDENT_AMBULATORY_CARE_PROVIDER_SITE_OTHER): Payer: BC Managed Care – PPO | Admitting: Physician Assistant

## 2012-04-28 VITALS — BP 93/70 | HR 88 | Temp 97.7°F | Resp 18 | Wt 123.0 lb

## 2012-04-28 DIAGNOSIS — R300 Dysuria: Secondary | ICD-10-CM

## 2012-04-28 DIAGNOSIS — N390 Urinary tract infection, site not specified: Secondary | ICD-10-CM

## 2012-04-28 LAB — POCT URINALYSIS DIPSTICK
Nitrite, UA: NEGATIVE
Protein, UA: 100
Urobilinogen, UA: >=8
pH, UA: 8.5

## 2012-04-28 LAB — POCT UA - MICROSCOPIC ONLY
Casts, Ur, LPF, POC: NEGATIVE
Crystals, Ur, HPF, POC: NEGATIVE

## 2012-04-28 MED ORDER — CIPROFLOXACIN 500 MG/5ML (10%) PO SUSR: 500.0000 mg | Freq: Two times a day (BID) | ORAL | Status: DC

## 2012-04-28 NOTE — Patient Instructions (Addendum)
Take the antibiotic as directed.  Be sure to finish the full course.  Plenty of fluids.  Please let us know if you are worsening or not improving.   Urinary Tract Infection Urinary tract infections (UTIs) can develop anywhere along your urinary tract. Your urinary tract is your body's drainage system for removing wastes and extra water. Your urinary tract includes two kidneys, two ureters, a bladder, and a urethra. Your kidneys are a pair of bean-shaped organs. Each kidney is about the size of your fist. They are located below your ribs, one on each side of your spine. CAUSES Infections are caused by microbes, which are microscopic organisms, including fungi, viruses, and bacteria. These organisms are so small that they can only be seen through a microscope. Bacteria are the microbes that most commonly cause UTIs. SYMPTOMS  Symptoms of UTIs may vary by age and gender of the patient and by the location of the infection. Symptoms in young women typically include a frequent and intense urge to urinate and a painful, burning feeling in the bladder or urethra during urination. Older women and men are more likely to be tired, shaky, and weak and have muscle aches and abdominal pain. A fever may mean the infection is in your kidneys. Other symptoms of a kidney infection include pain in your back or sides below the ribs, nausea, and vomiting. DIAGNOSIS To diagnose a UTI, your caregiver will ask you about your symptoms. Your caregiver also will ask to provide a urine sample. The urine sample will be tested for bacteria and white blood cells. White blood cells are made by your body to help fight infection. TREATMENT  Typically, UTIs can be treated with medication. Because most UTIs are caused by a bacterial infection, they usually can be treated with the use of antibiotics. The choice of antibiotic and length of treatment depend on your symptoms and the type of bacteria causing your infection. HOME CARE  INSTRUCTIONS  If you were prescribed antibiotics, take them exactly as your caregiver instructs you. Finish the medication even if you feel better after you have only taken some of the medication.  Drink enough water and fluids to keep your urine clear or pale yellow.  Avoid caffeine, tea, and carbonated beverages. They tend to irritate your bladder.  Empty your bladder often. Avoid holding urine for long periods of time.  Empty your bladder before and after sexual intercourse.  After a bowel movement, women should cleanse from front to back. Use each tissue only once. SEEK MEDICAL CARE IF:   You have back pain.  You develop a fever.  Your symptoms do not begin to resolve within 3 days. SEEK IMMEDIATE MEDICAL CARE IF:   You have severe back pain or lower abdominal pain.  You develop chills.  You have nausea or vomiting.  You have continued burning or discomfort with urination. MAKE SURE YOU:   Understand these instructions.  Will watch your condition.  Will get help right away if you are not doing well or get worse. Document Released: 11/08/2004 Document Revised: 07/31/2011 Document Reviewed: 03/09/2011 [facility] Patient Information 2013 [place], [place].

## 2012-04-28 NOTE — Progress Notes (Signed)
Subjective:    Patient ID: Destiny Simpson, female    DOB: October 17, 1985, 27 y.o.   MRN: [id number]  HPI   Ms. Crumrine is a very pleasant 27 yr old female who is here with concern for UTI.  States she has had symptoms for three days.  Experiencing lower back pain and lower abdominal pain.  Also with frequency and dysuria.  Denies hematuria.  No fever or chills, nausea or vomiting.  Gets UTIs frequently, thinks last was in September maybe.  Knows she has not been staying as hydrated as she should, currently working two jobs.  Denies vaginal symptoms, no concern for STI, does not want to be tested .    Review of Systems  Constitutional: Negative for fever and chills.  HENT: Negative.   Respiratory: Negative.   Cardiovascular: Negative.   Gastrointestinal: Positive for abdominal pain (lower). Negative for nausea, vomiting and diarrhea.  Genitourinary: Positive for dysuria and frequency. Negative for hematuria and vaginal discharge.  Musculoskeletal: Positive for back pain (low).  Skin: Negative.   Neurological: Negative.        Objective:   Physical Exam  Vitals reviewed. Constitutional: She is oriented to person, place, and time. She appears well-developed and well-nourished. No distress.  HENT:  Head: Normocephalic and atraumatic.  Eyes: Conjunctivae are normal. No scleral icterus.  Cardiovascular: Normal rate, regular rhythm and normal heart sounds.   Pulmonary/Chest: Effort normal and breath sounds normal. She has no wheezes. She has no rales.  Abdominal: Soft. There is tenderness (left flank). There is CVA tenderness (left). There is no rigidity, no rebound, no guarding and no tenderness at McBurney's point.  Neurological: She is alert and oriented to person, place, and time.  Skin: Skin is warm and dry.  Psychiatric: She has a normal mood and affect. Her behavior is normal.     Filed Vitals:   04/28/12 1900  BP: 93/70  Pulse: 88  Temp: 97.7 F (36.5 C)  Resp: 18      Results for orders placed in visit on 04/28/12  POCT UA - MICROSCOPIC ONLY      Result Value Range   WBC, Ur, HPF, POC 20-25     RBC, urine, microscopic 0-4     Bacteria, U Microscopic 1     Mucus, UA neg     Epithelial cells, urine per micros 0-3     Crystals, Ur, HPF, POC neg     Casts, Ur, LPF, POC neg     Yeast, UA neg    POCT URINALYSIS DIPSTICK      Result Value Range   Color, UA yellow     Clarity, UA clear.     Glucose, UA neg     Bilirubin, UA neg     Ketones, UA trace     Spec Grav, UA 1.015     Blood, UA trace     pH, UA 8.5     Protein, UA 100     Urobilinogen, UA >=8.0     Nitrite, UA neg     Leukocytes, UA moderate (2+)       Results for orders placed in visit on 04/28/12  POCT UA - MICROSCOPIC ONLY      Result Value Range   WBC, Ur, HPF, POC 20-25     RBC, urine, microscopic 0-4     Bacteria, U Microscopic 1     Mucus, UA neg     Epithelial cells, urine per micros 0-3  Crystals, Ur, HPF, POC neg     Casts, Ur, LPF, POC neg     Yeast, UA neg    POCT URINALYSIS DIPSTICK      Result Value Range   Color, UA yellow     Clarity, UA clear.     Glucose, UA neg     Bilirubin, UA neg     Ketones, UA trace     Spec Grav, UA 1.015     Blood, UA trace     pH, UA 8.5     Protein, UA 100     Urobilinogen, UA >=8.0     Nitrite, UA neg     Leukocytes, UA moderate (2+)         Assessment & Plan:  UTI (urinary tract infection) - Plan: Urine culture, ciprofloxacin (CIPRO) 500 MG/5ML (10%) suspension  Dysuria - Plan: POCT UA - Microscopic Only, POCT urinalysis dipstick, Urine culture   Ms. Casselman is a very pleasant 27 yr old female with UTI.  Urine culture sent.  Will treat with Cipro suspension.  Pt states she is unable to swallow pills and prefers liquid medication.  Encouraged plenty of fluids.  Discussed RTC precautions.  Pt understands and is in agreement with this plan.

## 2012-05-01 ENCOUNTER — Telehealth: Payer: Self-pay | Admitting: Physician Assistant

## 2012-05-01 MED ORDER — ONDANSETRON 8 MG PO TBDP: 8.0000 mg | ORAL_TABLET | Freq: Three times a day (TID) | ORAL | Status: DC | PRN

## 2012-05-01 NOTE — Telephone Encounter (Signed)
Called patient to advise  °

## 2012-05-01 NOTE — Telephone Encounter (Signed)
Sent Zofran to pharmacy.  Take about 20-30 minutes before the antibiotic

## 2012-05-01 NOTE — Telephone Encounter (Signed)
Message copied by Godfrey Pick on Thu May 01, 2012 12:28 PM ------      Message from: Gerrianne Scale      Created: Thu May 01, 2012 12:19 PM       Spoke with pt. Gave pt results patient states that she feels nausea with this medicine and that she do eat something while taking this medline could we please her some Zofran odt until she finish medicine she states that shell just tuff it out with this medicine she don't like to take pills ------

## 2012-05-22 NOTE — Progress Notes (Signed)
Subjective:    Patient ID: Destiny Simpson, female    DOB: 04-Jun-1985, 27 y.o.   MRN: [id number] Chief Complaint  Patient presents with  . Allergic Reaction    was seen this morning and given 2 injections now rash on arms   HPI  I saw Destiny Simpson several hours ago for a UTI. She has trouble swallowing pills so was given a dose of Rocephin 1g and she was having a sig amount of back pain and abd pain so he was given toradol. About an hour later she began itching and noticed raised red splotches that began over her arms and are now on her stomach and back.  No SHoB, wheezing, drooling or anything swelling.  Past Medical History  Diagnosis Date  . Ovarian cyst   . Abnormal Pap smear   . Allergy   . Asthma    Current Outpatient Prescriptions on File Prior to Visit  Medication Sig Dispense Refill  . levonorgestrel (MIRENA) 20 MCG/24HR IUD 1 each by Intrauterine route once.       No current facility-administered medications on file prior to visit.   No Known Allergies   Review of Systems  Constitutional: Negative for fever, chills and diaphoresis.  HENT: Negative for congestion, drooling, trouble swallowing, neck pain, neck stiffness and voice change.   Eyes: Negative for discharge, redness and itching.  Respiratory: Negative for cough, chest tightness, shortness of breath and wheezing.   Cardiovascular: Negative for chest pain, palpitations and leg swelling.  Gastrointestinal: Positive for nausea and abdominal pain. Negative for vomiting, diarrhea and constipation.  Genitourinary: Positive for dysuria, urgency, flank pain, decreased urine volume, difficulty urinating and pelvic pain.  Musculoskeletal: Negative for myalgias, joint swelling and arthralgias.  Skin: Positive for color change and rash. Negative for pallor and wound.  Neurological: Negative for dizziness, syncope, weakness and light-headedness.  Hematological: Negative for adenopathy. Does not bruise/bleed easily.   Psychiatric/Behavioral: Negative for sleep disturbance. The patient is not nervous/anxious.       BP 100/60  Pulse 60  Temp(Src) 98 F (36.7 C) (Oral)  Resp 16  Ht 5' 1.5" (1.562 m)  Wt 119 lb (53.978 kg)  BMI 22.12 kg/m2  SpO2 100% Objective:   Physical Exam  Constitutional: She is oriented to person, place, and time. She appears well-developed and well-nourished. No distress.  HENT:  Head: Normocephalic and atraumatic. No trismus in the jaw.  Right Ear: External ear normal.  Left Ear: External ear normal.  Nose: No mucosal edema or rhinorrhea.  Mouth/Throat: Uvula is midline, oropharynx is clear and moist and mucous membranes are normal. No edematous. No oropharyngeal exudate, posterior oropharyngeal edema or posterior oropharyngeal erythema.  Eyes: Conjunctivae are normal. No scleral icterus.  Neck: Normal range of motion. Neck supple. No thyromegaly present.  Cardiovascular: Normal rate, regular rhythm, normal heart sounds and intact distal pulses.   Pulmonary/Chest: Effort normal and breath sounds normal. No respiratory distress.  Musculoskeletal: She exhibits no edema.  Lymphadenopathy:    She has no cervical adenopathy.  Neurological: She is alert and oriented to person, place, and time.  Skin: Skin is warm and dry. Rash noted. Rash is urticarial. She is not diaphoretic. No erythema.  Psychiatric: She has a normal mood and affect. Her behavior is normal.      Assessment & Plan:  Urticaria Given zyrtec and zantac in office and pt monitored with some relief. Will hold off on steroids since pt currently has an acute infection but she knows  to RTC immed if worsening.  Suspect reaction to Rocpehin. Meds ordered this encounter  Medications  . Cetirizine HCl (ZYRTEC) 5 MG/5ML SYRP    Sig: Take 10 mLs (10 mg total) by mouth daily.    Dispense:  120 mL    Refill:  1

## 2012-06-09 ENCOUNTER — Encounter (HOSPITAL_COMMUNITY): Payer: Self-pay | Admitting: Family Medicine

## 2012-06-09 ENCOUNTER — Emergency Department (HOSPITAL_COMMUNITY): Payer: No Typology Code available for payment source

## 2012-06-09 ENCOUNTER — Emergency Department (HOSPITAL_COMMUNITY)
Admission: EM | Admit: 2012-06-09 | Discharge: 2012-06-09 | Disposition: A | Discharge status: Home / Self Care | Payer: No Typology Code available for payment source | Attending: Emergency Medicine | Admitting: Emergency Medicine

## 2012-06-09 DIAGNOSIS — Y9389 Activity, other specified: Secondary | ICD-10-CM | POA: Insufficient documentation

## 2012-06-09 DIAGNOSIS — S8000XA Contusion of unspecified knee, initial encounter: Secondary | ICD-10-CM | POA: Insufficient documentation

## 2012-06-09 DIAGNOSIS — Y92410 Unspecified street and highway as the place of occurrence of the external cause: Secondary | ICD-10-CM | POA: Insufficient documentation

## 2012-06-09 DIAGNOSIS — S39012A Strain of muscle, fascia and tendon of lower back, initial encounter: Secondary | ICD-10-CM

## 2012-06-09 DIAGNOSIS — S2341XA Sprain of ribs, initial encounter: Secondary | ICD-10-CM | POA: Insufficient documentation

## 2012-06-09 DIAGNOSIS — Z8742 Personal history of other diseases of the female genital tract: Secondary | ICD-10-CM | POA: Insufficient documentation

## 2012-06-09 DIAGNOSIS — J45909 Unspecified asthma, uncomplicated: Secondary | ICD-10-CM | POA: Insufficient documentation

## 2012-06-09 DIAGNOSIS — Z79899 Other long term (current) drug therapy: Secondary | ICD-10-CM | POA: Insufficient documentation

## 2012-06-09 DIAGNOSIS — S8002XA Contusion of left knee, initial encounter: Secondary | ICD-10-CM

## 2012-06-09 DIAGNOSIS — S29011A Strain of muscle and tendon of front wall of thorax, initial encounter: Secondary | ICD-10-CM

## 2012-06-09 DIAGNOSIS — IMO0002 Reserved for concepts with insufficient information to code with codable children: Secondary | ICD-10-CM | POA: Insufficient documentation

## 2012-06-09 MED ORDER — KETOROLAC TROMETHAMINE 60 MG/2ML IM SOLN
60.0000 mg | Freq: Once | INTRAMUSCULAR | Status: AC
  Administered 2012-06-09: 60 mg via INTRAMUSCULAR
  Filled 2012-06-09: qty 2

## 2012-06-09 MED ORDER — IBUPROFEN 100 MG PO CHEW: 400.0000 mg | CHEWABLE_TABLET | Freq: Four times a day (QID) | ORAL | Status: DC | PRN
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
## 2012-06-11 ENCOUNTER — Ambulatory Visit (INDEPENDENT_AMBULATORY_CARE_PROVIDER_SITE_OTHER): Payer: BC Managed Care – PPO | Admitting: Emergency Medicine

## 2012-06-11 ENCOUNTER — Ambulatory Visit: Payer: BC Managed Care – PPO

## 2012-06-11 VITALS — BP 110/62 | HR 64 | Temp 98.0°F | Resp 16 | Ht 62.0 in | Wt 122.6 lb

## 2012-06-11 DIAGNOSIS — M542 Cervicalgia: Secondary | ICD-10-CM

## 2012-06-11 DIAGNOSIS — R079 Chest pain, unspecified: Secondary | ICD-10-CM

## 2012-06-11 DIAGNOSIS — M545 Low back pain, unspecified: Secondary | ICD-10-CM

## 2012-06-11 DIAGNOSIS — R0781 Pleurodynia: Secondary | ICD-10-CM

## 2012-06-11 MED ORDER — HYDROCODONE-ACETAMINOPHEN 7.5-500 MG/15ML PO SOLN: 5.0000 mL | Freq: Four times a day (QID) | ORAL | Status: DC | PRN
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
## 2012-06-11 NOTE — Patient Instructions (Addendum)

## 2012-06-16 ENCOUNTER — Telehealth: Payer: Self-pay

## 2012-06-16 NOTE — Telephone Encounter (Signed)
Please advise 

## 2012-06-16 NOTE — Telephone Encounter (Signed)
What type of work does she do? As long as it is a job that does not require work boots I think this should be fine, although she may need to plan to come in for a recheck so we can re-assess.
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
## 2012-06-16 NOTE — Telephone Encounter (Signed)
Called, she works at [employer]. Note provided she should wear flats faxed.

## 2012-06-17 ENCOUNTER — Telehealth: Payer: Self-pay

## 2012-06-17 NOTE — Telephone Encounter (Signed)
Ok/ this is done for her.

## 2012-06-17 NOTE — Telephone Encounter (Signed)
Amy wrote a letter for patient allowing her to wear flat shoes to work, the employer needs it to say flat SANDALS not shoes.  Fax to [phone] when complete please.

## 2012-06-17 NOTE — Telephone Encounter (Signed)
Ok to change note to say sandals

## 2012-08-07 ENCOUNTER — Telehealth: Payer: Self-pay
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
## 2012-08-08 NOTE — Telephone Encounter (Signed)
Records ready in the pick up drawer patient notified by phone. She says she will sign release form.

## 2012-09-12 ENCOUNTER — Ambulatory Visit (INDEPENDENT_AMBULATORY_CARE_PROVIDER_SITE_OTHER): Payer: BC Managed Care – PPO | Admitting: Physician Assistant

## 2012-09-12 VITALS — BP 116/82 | HR 92 | Temp 98.4°F | Resp 18 | Ht 62.0 in | Wt 119.6 lb

## 2012-09-12 DIAGNOSIS — R3915 Urgency of urination: Secondary | ICD-10-CM

## 2012-09-12 DIAGNOSIS — N390 Urinary tract infection, site not specified: Secondary | ICD-10-CM

## 2012-09-12 DIAGNOSIS — R109 Unspecified abdominal pain: Secondary | ICD-10-CM

## 2012-09-12 LAB — POCT UA - MICROSCOPIC ONLY
Casts, Ur, LPF, POC: NEGATIVE
Mucus, UA: NEGATIVE

## 2012-09-12 LAB — POCT URINALYSIS DIPSTICK
Bilirubin, UA: NEGATIVE
Glucose, UA: NEGATIVE
Nitrite, UA: NEGATIVE
pH, UA: 7

## 2012-09-12 MED ORDER — CEPHALEXIN 250 MG/5ML PO SUSR: 500.0000 mg | Freq: Two times a day (BID) | ORAL | Status: DC

## 2012-09-12 MED ORDER — KETOROLAC TROMETHAMINE 60 MG/2ML IM SOLN
60.0000 mg | Freq: Once | INTRAMUSCULAR | Status: AC
  Administered 2012-09-12: 60 mg via INTRAMUSCULAR

## 2012-09-12 NOTE — Progress Notes (Signed)
  Subjective:    Patient ID: Destiny Simpson, female    DOB: 05-22-1985, 27 y.o.   MRN: [id number]  HPI 27 year old female presents with acute onset of suprapubic pressure, pain with urination, and urinary hesitancy.  Does have hx of UTI's that have occurred fairly frequently over the past few years.  Last in 04/2012 - treated with Cipro which made her feel nauseous. Has used Keflex in the past which worked well and she tolerated much better. Denies vaginal discharge, nausea, vomiting, fever, chills, or flank pain.   Patient is otherwise doing well with no other concerns today.     Review of Systems  Constitutional: Negative for fever and chills.  Gastrointestinal: Positive for abdominal pain (suprapubic). Negative for nausea and vomiting.  Genitourinary: Positive for frequency and difficulty urinating (hesitancy). Negative for dysuria, flank pain and vaginal discharge.       Objective:   Physical Exam  Constitutional: She is oriented to person, place, and time. She appears well-developed and well-nourished.  HENT:  Head: Normocephalic and atraumatic.  Right Ear: External ear normal.  Left Ear: External ear normal.  Eyes: Conjunctivae are normal.  Neck: Normal range of motion.  Cardiovascular: Normal rate.   Pulmonary/Chest: Effort normal.  Abdominal: Soft. Bowel sounds are normal. There is tenderness (suprapubic). There is no rebound, no guarding and no CVA tenderness.  Neurological: She is alert and oriented to person, place, and time.  Psychiatric: She has a normal mood and affect. Her behavior is normal. Judgment and thought content normal.      Results for orders placed in visit on 09/12/12  POCT URINALYSIS DIPSTICK      Result Value Range   Color, UA yellow     Clarity, UA cloudy     Glucose, UA neg     Bilirubin, UA neg     Ketones, UA trace     Spec Grav, UA 1.020     Blood, UA large     pH, UA 7.0     Protein, UA 100     Urobilinogen, UA 2.0     Nitrite, UA neg      Leukocytes, UA large (3+)    POCT UA - MICROSCOPIC ONLY      Result Value Range   WBC, Ur, HPF, POC tntc     RBC, urine, microscopic tntc     Bacteria, U Microscopic 1+     Mucus, UA neg     Epithelial cells, urine per micros 1-2     Crystals, Ur, HPF, POC neg     Casts, Ur, LPF, POC neg     Yeast, UA neg          Assessment & Plan:  UTI (urinary tract infection)  Urgency of urination - Plan: POCT urinalysis dipstick, POCT UA - Microscopic Only, cephALEXin (KEFLEX) 250 MG/5ML suspension, Urine culture  Abdominal pain, unspecified site - Plan: ketorolac (TORADOL) injection 60 mg Toradol 60 mg IM given today to help with pain so she can go to work.  Patient does not tolerate pills so will rx Keflex 250/5 mg suspension 10 ml bid x 7 days Urine culture sent Follow up if symptoms worsen or fail to improve

## 2012-09-14 LAB — URINE CULTURE

## 2013-02-27 ENCOUNTER — Inpatient Hospital Stay (HOSPITAL_COMMUNITY)
Admission: AD | Admit: 2013-02-27 | Discharge: 2013-02-27 | Disposition: A | Discharge status: Home / Self Care | Payer: BC Managed Care – PPO | Source: Ambulatory Visit | Attending: Obstetrics and Gynecology | Admitting: Obstetrics and Gynecology

## 2013-02-27 ENCOUNTER — Inpatient Hospital Stay (HOSPITAL_COMMUNITY): Payer: BC Managed Care – PPO

## 2013-02-27 ENCOUNTER — Encounter (HOSPITAL_COMMUNITY): Payer: Self-pay | Admitting: *Deleted

## 2013-02-27 DIAGNOSIS — N83209 Unspecified ovarian cyst, unspecified side: Secondary | ICD-10-CM

## 2013-02-27 DIAGNOSIS — N831 Corpus luteum cyst of ovary, unspecified side: Secondary | ICD-10-CM | POA: Insufficient documentation

## 2013-02-27 DIAGNOSIS — R109 Unspecified abdominal pain: Secondary | ICD-10-CM | POA: Insufficient documentation

## 2013-02-27 DIAGNOSIS — N949 Unspecified condition associated with female genital organs and menstrual cycle: Secondary | ICD-10-CM | POA: Insufficient documentation

## 2013-02-27 LAB — URINALYSIS, ROUTINE W REFLEX MICROSCOPIC
BILIRUBIN URINE: NEGATIVE
Glucose, UA: NEGATIVE mg/dL
Hgb urine dipstick: NEGATIVE
Ketones, ur: NEGATIVE mg/dL
Leukocytes, UA: NEGATIVE
NITRITE: NEGATIVE
PH: 5.5 (ref 5.0–8.0)
Protein, ur: NEGATIVE mg/dL
UROBILINOGEN UA: 0.2 mg/dL (ref 0.0–1.0)

## 2013-02-27 LAB — WET PREP, GENITAL
TRICH WET PREP: NONE SEEN
Yeast Wet Prep HPF POC: NONE SEEN

## 2013-02-27 LAB — POCT PREGNANCY, URINE: PREG TEST UR: NEGATIVE

## 2013-02-27 MED ORDER — IBUPROFEN 100 MG PO CHEW: 800.0000 mg | CHEWABLE_TABLET | Freq: Once | ORAL | Status: DC

## 2013-02-27 MED ORDER — IBUPROFEN 100 MG/5ML PO SUSP
800.0000 mg | Freq: Once | ORAL | Status: AC
  Administered 2013-02-27: 800 mg via ORAL
  Filled 2013-02-27: qty 40

## 2013-02-27 NOTE — MAU Provider Note (Signed)
History     CSN: [id number]  Arrival date and time: 02/27/13 [id number]   First Provider Initiated Contact with Patient 02/27/13 1959      Chief Complaint  Patient presents with  . Abdominal Cramping   HPI  Destiny Simpson is a 28 y.o. G1P1001 who presents today with cramping. She states that she had a cyst in 2011 and 2013. She rates her pain 8.5/10 currently. She has not taken anything for the pain at this time because she is unable to swallow pills. She tried to call her OBGYNs office, but they closed today at 1300. She was last seen by Dr. Chevis PrettyMezer 07/2012. She does not have a period due to Mirena IUD.   Past Medical History  Diagnosis Date  . Ovarian cyst   . Abnormal Pap smear   . Allergy   . Asthma     Past Surgical History  Procedure Laterality Date  . Colposcopy      Family History  Problem Relation Age of Onset  . Hypertension Maternal Grandmother   . Diabetes Maternal Grandmother   . Cancer Maternal Grandmother   . Cancer Paternal Grandmother   . Hypertension Paternal Grandfather     History  Substance Use Topics  . Smoking status: Never Smoker   . Smokeless tobacco: Not on file  . Alcohol Use: No    Allergies:  Allergies  Allergen Reactions  . Pepto-Bismol [Bismuth Subsalicylate] Hives    Prescriptions prior to admission  Medication Sig Dispense Refill  . Azelaic Acid (FINACEA) 15 % cream Apply 1 application topically every morning. After skin is thoroughly washed and patted dry, gently but thoroughly massage a thin film of azelaic acid cream into the affected area twice daily, in the morning and evening.      . cephALEXin (KEFLEX) 250 MG/5ML suspension Take 10 mLs (500 mg total) by mouth 2 (two) times daily.  150 mL  0  . HYDROcodone-acetaminophen (LORTAB) 7.5-500 MG/15ML solution Take 5 mLs by mouth every 6 (six) hours as needed for pain.  120 mL  0  . ibuprofen (CHILDRENS MOTRIN) 100 MG chewable tablet Chew 4 tablets (400 mg total) by mouth every 6  (six) hours as needed for pain.  40 tablet  0  . levonorgestrel (MIRENA) 20 MCG/24HR IUD 1 each by Intrauterine route once.        ROS Physical Exam   Blood pressure 120/72, pulse 66, temperature 98.5 F (36.9 C), resp. rate 18, height 5' 1.5" (1.562 m), weight 54.25 kg (119 lb 9.6 oz).  Physical Exam  Nursing note and vitals reviewed. Constitutional: She is oriented to person, place, and time. She appears well-developed and well-nourished. No distress.  Cardiovascular: Normal rate.   Respiratory: Effort normal.  GI: Soft. There is no tenderness.  Genitourinary:   External: no lesion Vagina: small amount of white discharge Cervix: pink, smooth, no CMT, no IUD tail seen. Patient states that her Dr. Barbette Orut it very short, and is usually unable to see it.  Uterus: NSSC Adnexa: tender on the left. Right, non-tender    Neurological: She is alert and oriented to person, place, and time.  Skin: Skin is warm and dry.  Psychiatric: She has a normal mood and affect.    MAU Course  Procedures  [place] Transvaginal Non-ob  02/27/2013   CLINICAL DATA:  Left pelvic pain, IUD since 2013  EXAM: TRANSABDOMINAL AND TRANSVAGINAL ULTRASOUND OF PELVIS  TECHNIQUE: Both transabdominal and transvaginal ultrasound examinations of the pelvis were  performed. Transabdominal technique was performed for global imaging of the pelvis including uterus, ovaries, adnexal regions, and pelvic cul-de-sac. It was necessary to proceed with endovaginal exam following the transabdominal exam to visualize the bilateral ovaries.  COMPARISON:  None  FINDINGS: Uterus  Measurements: 8.1 x 3.1 x 3.9 cm. No fibroids or other mass visualized.  Endometrium  Thickness: 5 mm.  IUD in satisfactory position.  Right ovary  Measurements: 3.9 x 1.7 x 1.4 cm. Normal appearance/no adnexal mass.  Left ovary  Measurements: 4.6 x 2.1 x 2.6 cm. Normal appearance/no adnexal mass. Probable 1.8 cm hemorrhagic corpus luteal cyst.  Other findings  No free  fluid.  IMPRESSION: IUD in satisfactory position.  1.8 cm hemorrhagic left corpus luteal cyst.   Electronically Signed   By: Charline Bills M.D.   On: 02/27/2013 21:18   US Pelvis Complete  02/27/2013   CLINICAL DATA:  Left pelvic pain, IUD since 2013  EXAM: TRANSABDOMINAL AND TRANSVAGINAL ULTRASOUND OF PELVIS  TECHNIQUE: Both transabdominal and transvaginal ultrasound examinations of the pelvis were performed. Transabdominal technique was performed for global imaging of the pelvis including uterus, ovaries, adnexal regions, and pelvic cul-de-sac. It was necessary to proceed with endovaginal exam following the transabdominal exam to visualize the bilateral ovaries.  COMPARISON:  None  FINDINGS: Uterus  Measurements: 8.1 x 3.1 x 3.9 cm. No fibroids or other mass visualized.  Endometrium  Thickness: 5 mm.  IUD in satisfactory position.  Right ovary  Measurements: 3.9 x 1.7 x 1.4 cm. Normal appearance/no adnexal mass.  Left ovary  Measurements: 4.6 x 2.1 x 2.6 cm. Normal appearance/no adnexal mass. Probable 1.8 cm hemorrhagic corpus luteal cyst.  Other findings  No free fluid.  IMPRESSION: IUD in satisfactory position.  1.8 cm hemorrhagic left corpus luteal cyst.   Electronically Signed   By: Charline Bills M.D.   On: 02/27/2013 21:18   Results for orders placed during the hospital encounter of 02/27/13 (from the past 24 hour(s))  URINALYSIS, ROUTINE W REFLEX MICROSCOPIC     Status: Abnormal   Collection Time    02/27/13  7:26 PM      Result Value Range   Color, Urine YELLOW  YELLOW   APPearance CLEAR  CLEAR   Specific Gravity, Urine >1.030 (*) 1.005 - 1.030   pH 5.5  5.0 - 8.0   Glucose, UA NEGATIVE  NEGATIVE mg/dL   Hgb urine dipstick NEGATIVE  NEGATIVE   Bilirubin Urine NEGATIVE  NEGATIVE   Ketones, ur NEGATIVE  NEGATIVE mg/dL   Protein, ur NEGATIVE  NEGATIVE mg/dL   Urobilinogen, UA 0.2  0.0 - 1.0 mg/dL   Nitrite NEGATIVE  NEGATIVE   Leukocytes, UA NEGATIVE  NEGATIVE  POCT PREGNANCY,  URINE     Status: None   Collection Time    02/27/13  7:45 PM      Result Value Range   Preg Test, Ur NEGATIVE  NEGATIVE  WET PREP, GENITAL     Status: Abnormal   Collection Time    02/27/13  8:03 PM      Result Value Range   Yeast Wet Prep HPF POC NONE SEEN  NONE SEEN   Trich, Wet Prep NONE SEEN  NONE SEEN   Clue Cells Wet Prep HPF POC FEW (*) NONE SEEN   WBC, Wet Prep HPF POC FEW (*) NONE SEEN     Assessment and Plan   1. Other and unspecified ovarian cyst    Comfort measures reviewed Continue ibuprofen as  needed  Follow-up Information   Schedule an appointment as soon as possible for a visit with Janifer Adie, MD.   Specialty:  Gynecology   Contact information:   [address] [place], [address] [place] [address] [phone]        Tawnya Crook 02/27/2013, 8:00 PM

## 2013-02-27 NOTE — Discharge Instructions (Signed)

## 2013-02-27 NOTE — MAU Note (Signed)
I think I have another cyst on my L ovary. Having a lot of cramping since yesterday. Last cyst was May 2013 on L. Have had them since 2011 and they fluctuate from R to L side.

## 2013-02-28 LAB — GC/CHLAMYDIA PROBE AMP
CT Probe RNA: NEGATIVE
GC PROBE AMP APTIMA: NEGATIVE

## 2013-04-06 ENCOUNTER — Encounter (HOSPITAL_COMMUNITY)
Admission: RE | Admit: 2013-04-06 | Discharge: 2013-04-06 | Disposition: A | Discharge status: Home / Self Care | Payer: BC Managed Care – PPO | Source: Ambulatory Visit | Attending: Obstetrics and Gynecology | Admitting: Obstetrics and Gynecology

## 2013-04-06 ENCOUNTER — Encounter (HOSPITAL_COMMUNITY): Payer: Self-pay

## 2013-04-06 DIAGNOSIS — G8929 Other chronic pain: Secondary | ICD-10-CM | POA: Diagnosis not present

## 2013-04-06 DIAGNOSIS — N949 Unspecified condition associated with female genital organs and menstrual cycle: Secondary | ICD-10-CM | POA: Diagnosis present

## 2013-04-06 DIAGNOSIS — N83209 Unspecified ovarian cyst, unspecified side: Secondary | ICD-10-CM | POA: Diagnosis not present

## 2013-04-06 DIAGNOSIS — N803 Endometriosis of pelvic peritoneum, unspecified: Secondary | ICD-10-CM | POA: Diagnosis not present

## 2013-04-06 DIAGNOSIS — J45909 Unspecified asthma, uncomplicated: Secondary | ICD-10-CM | POA: Diagnosis not present

## 2013-04-06 LAB — CBC
HCT: 36.9 % (ref 36.0–46.0)
Hemoglobin: 12.3 g/dL (ref 12.0–15.0)
MCH: 29.1 pg (ref 26.0–34.0)
MCHC: 33.3 g/dL (ref 30.0–36.0)
MCV: 87.4 fL (ref 78.0–100.0)
Platelets: 255 10*3/uL (ref 150–400)
RBC: 4.22 MIL/uL (ref 3.87–5.11)
RDW: 12.8 % (ref 11.5–15.5)
WBC: 4.9 10*3/uL (ref 4.0–10.5)

## 2013-04-06 NOTE — Patient Instructions (Signed)
20 Destiny Simpson  04/06/2013   Your procedure is scheduled on:  04/09/13  [facility].  Pick up the phone at the desk and dial [phone].   Call this number if you have problems the morning of surgery: [phone]   Remember:   Do not eat food:After Midnight.  Do not drink clear liquids: After Midnight.  Take these medicines the morning of surgery with A SIP OF WATER: NA   Do not wear jewelry, make-up or nail polish.  Do not wear lotions, powders, or perfumes. You may wear deodorant.  Do not shave 24 hours prior to surgery.  Do not bring valuables to the hospital.  [facility] not   responsible for any belongings or valuables brought to the hospital.  Contacts, dentures or bridgework may not be worn into surgery.  Leave suitcase in the car. After surgery it may be brought to your room.  For patients admitted to the hospital, checkout time is 11:00 AM the day of              discharge.   Patients discharged the day of surgery will not be allowed to drive             home.  Name and phone number of your driver: sister  Destiny Simpson  Special Instructions:      Please read over the following fact sheets that you were given:   Surgical Site Infection Prevention

## 2013-04-07 NOTE — H&P (Addendum)
28 yo G2P1 with pelvic pain and recurrent ovarian cyst persists for surgical management  PMHx:  Negative PSHx:  SVD All:  pepto Meds:  mirena IUD FHx:  Breast & ovarian ca SHx:  Negative tobacco  AF, VSS Gen - NAD CV - RRR Lungs - clear Abd - soft, mildly tender bilateral LQ PV - uterus mobile, NT  US - small ovarian cyst, + free fluid  A/P:  Chronic pelvic pain Diagnostic laparoscopy, possible ovarian cystectomy R/b/a discussed, questions answered, informed consent

## 2013-04-08 DIAGNOSIS — N809 Endometriosis, unspecified: Secondary | ICD-10-CM | POA: Insufficient documentation

## 2013-04-08 MED ORDER — DEXTROSE 5 % IV SOLN
2.0000 g | INTRAVENOUS | Status: AC
  Administered 2013-04-09: 2 g via INTRAVENOUS
  Filled 2013-04-08: qty 2

## 2013-04-09 ENCOUNTER — Encounter (HOSPITAL_COMMUNITY): Payer: Self-pay | Admitting: Anesthesiology

## 2013-04-09 ENCOUNTER — Encounter (HOSPITAL_COMMUNITY)
Admission: RE | Disposition: A | Discharge status: Home / Self Care | Payer: Self-pay | Source: Ambulatory Visit | Attending: Obstetrics and Gynecology

## 2013-04-09 ENCOUNTER — Ambulatory Visit (HOSPITAL_COMMUNITY): Payer: BC Managed Care – PPO | Admitting: Anesthesiology

## 2013-04-09 ENCOUNTER — Encounter (HOSPITAL_COMMUNITY): Payer: BC Managed Care – PPO | Admitting: Anesthesiology

## 2013-04-09 ENCOUNTER — Ambulatory Visit (HOSPITAL_COMMUNITY)
Admission: RE | Admit: 2013-04-09 | Discharge: 2013-04-09 | Disposition: A | Discharge status: Home / Self Care | Payer: BC Managed Care – PPO | Source: Ambulatory Visit | Attending: Obstetrics and Gynecology | Admitting: Obstetrics and Gynecology

## 2013-04-09 DIAGNOSIS — N949 Unspecified condition associated with female genital organs and menstrual cycle: Secondary | ICD-10-CM | POA: Insufficient documentation

## 2013-04-09 DIAGNOSIS — N803 Endometriosis of pelvic peritoneum, unspecified: Secondary | ICD-10-CM | POA: Insufficient documentation

## 2013-04-09 DIAGNOSIS — G8929 Other chronic pain: Secondary | ICD-10-CM | POA: Insufficient documentation

## 2013-04-09 DIAGNOSIS — J45909 Unspecified asthma, uncomplicated: Secondary | ICD-10-CM | POA: Insufficient documentation

## 2013-04-09 DIAGNOSIS — N83209 Unspecified ovarian cyst, unspecified side: Secondary | ICD-10-CM | POA: Insufficient documentation

## 2013-04-09 HISTORY — PX: ABLATION ON ENDOMETRIOSIS: SHX5787

## 2013-04-09 HISTORY — PX: OVARIAN CYST REMOVAL: SHX89

## 2013-04-09 LAB — PREGNANCY, URINE: PREG TEST UR: NEGATIVE

## 2013-04-09 SURGERY — EXCISION, CYST, OVARY
Anesthesia: General | Site: Abdomen

## 2013-04-09 MED ORDER — FENTANYL CITRATE 0.05 MG/ML IJ SOLN
INTRAMUSCULAR | Status: AC
  Filled 2013-04-09: qty 2

## 2013-04-09 MED ORDER — ROCURONIUM BROMIDE 100 MG/10ML IV SOLN
INTRAVENOUS | Status: DC | PRN
  Administered 2013-04-09: 25 mg via INTRAVENOUS

## 2013-04-09 MED ORDER — BUPIVACAINE HCL (PF) 0.25 % IJ SOLN
INTRAMUSCULAR | Status: AC
  Filled 2013-04-09: qty 30

## 2013-04-09 MED ORDER — GLYCOPYRROLATE 0.2 MG/ML IJ SOLN
INTRAMUSCULAR | Status: AC
  Filled 2013-04-09: qty 3

## 2013-04-09 MED ORDER — DEXAMETHASONE SODIUM PHOSPHATE 10 MG/ML IJ SOLN
INTRAMUSCULAR | Status: DC | PRN
  Administered 2013-04-09: 10 mg via INTRAVENOUS

## 2013-04-09 MED ORDER — KETOROLAC TROMETHAMINE 30 MG/ML IJ SOLN
INTRAMUSCULAR | Status: DC | PRN
  Administered 2013-04-09: 30 mg via INTRAVENOUS

## 2013-04-09 MED ORDER — KETOROLAC TROMETHAMINE 30 MG/ML IJ SOLN
INTRAMUSCULAR | Status: AC
  Filled 2013-04-09: qty 1

## 2013-04-09 MED ORDER — FENTANYL CITRATE 0.05 MG/ML IJ SOLN
25.0000 ug | INTRAMUSCULAR | Status: DC | PRN
  Administered 2013-04-09 (×3): 50 ug via INTRAVENOUS

## 2013-04-09 MED ORDER — LIDOCAINE HCL (CARDIAC) 20 MG/ML IV SOLN
INTRAVENOUS | Status: DC | PRN
  Administered 2013-04-09: 2 mg via INTRAVENOUS

## 2013-04-09 MED ORDER — FENTANYL CITRATE 0.05 MG/ML IJ SOLN
INTRAMUSCULAR | Status: DC | PRN
Start: 2013-04-09 — End: 2013-04-09
  Administered 2013-04-09 (×3): 50 ug via INTRAVENOUS

## 2013-04-09 MED ORDER — ONDANSETRON HCL 4 MG/2ML IJ SOLN
INTRAMUSCULAR | Status: DC | PRN
  Administered 2013-04-09: 4 mg via INTRAVENOUS

## 2013-04-09 MED ORDER — MIDAZOLAM HCL 2 MG/2ML IJ SOLN
INTRAMUSCULAR | Status: AC
  Filled 2013-04-09: qty 2

## 2013-04-09 MED ORDER — ACETAMINOPHEN 160 MG/5ML PO SOLN
ORAL | Status: AC
  Filled 2013-04-09: qty 20.3

## 2013-04-09 MED ORDER — NEOSTIGMINE METHYLSULFATE 1 MG/ML IJ SOLN
INTRAMUSCULAR | Status: DC | PRN
  Administered 2013-04-09: 3 mg via INTRAVENOUS

## 2013-04-09 MED ORDER — ACETAMINOPHEN 160 MG/5ML PO SOLN
325.0000 mg | ORAL | Status: DC | PRN
  Administered 2013-04-09: 650 mg via ORAL

## 2013-04-09 MED ORDER — DEXAMETHASONE SODIUM PHOSPHATE 10 MG/ML IJ SOLN
INTRAMUSCULAR | Status: AC
  Filled 2013-04-09: qty 1

## 2013-04-09 MED ORDER — NEOSTIGMINE METHYLSULFATE 1 MG/ML IJ SOLN
INTRAMUSCULAR | Status: AC
  Filled 2013-04-09: qty 1

## 2013-04-09 MED ORDER — BUPIVACAINE HCL (PF) 0.25 % IJ SOLN
INTRAMUSCULAR | Status: DC | PRN
  Administered 2013-04-09: 5 mL

## 2013-04-09 MED ORDER — MIDAZOLAM HCL 2 MG/2ML IJ SOLN
INTRAMUSCULAR | Status: DC | PRN
  Administered 2013-04-09 (×2): 1 mg via INTRAVENOUS

## 2013-04-09 MED ORDER — OXYCODONE-ACETAMINOPHEN 5-325 MG PO TABS: 1.0000 | ORAL_TABLET | ORAL | Status: DC | PRN

## 2013-04-09 MED ORDER — ONDANSETRON HCL 4 MG/2ML IJ SOLN: 4.0000 mg | Freq: Once | INTRAMUSCULAR | Status: DC | PRN

## 2013-04-09 MED ORDER — PROPOFOL 10 MG/ML IV EMUL
INTRAVENOUS | Status: AC
  Filled 2013-04-09: qty 20

## 2013-04-09 MED ORDER — ONDANSETRON HCL 4 MG/2ML IJ SOLN
INTRAMUSCULAR | Status: AC
  Filled 2013-04-09: qty 2

## 2013-04-09 MED ORDER — GLYCOPYRROLATE 0.2 MG/ML IJ SOLN
INTRAMUSCULAR | Status: DC | PRN
  Administered 2013-04-09: .5 mg via INTRAVENOUS

## 2013-04-09 MED ORDER — ROCURONIUM BROMIDE 100 MG/10ML IV SOLN
INTRAVENOUS | Status: AC
  Filled 2013-04-09: qty 1

## 2013-04-09 MED ORDER — FENTANYL CITRATE 0.05 MG/ML IJ SOLN
INTRAMUSCULAR | Status: AC
  Administered 2013-04-09: 50 ug via INTRAVENOUS
  Filled 2013-04-09: qty 2

## 2013-04-09 MED ORDER — OXYCODONE HCL 5 MG/5ML PO SOLN: 5.0000 mg | Freq: Once | ORAL | Status: DC | PRN

## 2013-04-09 MED ORDER — PROPOFOL 10 MG/ML IV BOLUS
INTRAVENOUS | Status: DC | PRN
  Administered 2013-04-09: 170 mg via INTRAVENOUS

## 2013-04-09 MED ORDER — LIDOCAINE HCL (CARDIAC) 20 MG/ML IV SOLN
INTRAVENOUS | Status: AC
  Filled 2013-04-09: qty 5

## 2013-04-09 MED ORDER — ACETAMINOPHEN 325 MG PO TABS: 325.0000 mg | ORAL_TABLET | ORAL | Status: DC | PRN

## 2013-04-09 MED ORDER — LIDOCAINE 1%/NA BICARB 0.1 MEQ INJECTION
INJECTION | INTRAVENOUS | Status: DC
Start: 2013-04-09 — End: 2013-04-09
  Filled 2013-04-09: qty 1

## 2013-04-09 MED ORDER — KETOROLAC TROMETHAMINE 30 MG/ML IJ SOLN
15.0000 mg | Freq: Once | INTRAMUSCULAR | Status: AC | PRN
  Administered 2013-04-09: 30 mg via INTRAVENOUS

## 2013-04-09 MED ORDER — LACTATED RINGERS IV SOLN
INTRAVENOUS | Status: DC
  Administered 2013-04-09: 07:00:00 via INTRAVENOUS

## 2013-04-09 MED ORDER — OXYCODONE HCL 5 MG PO TABS: 5.0000 mg | ORAL_TABLET | Freq: Once | ORAL | Status: DC | PRN

## 2013-04-09 MED ORDER — FENTANYL CITRATE 0.05 MG/ML IJ SOLN
INTRAMUSCULAR | Status: AC
  Filled 2013-04-09: qty 5

## 2013-04-09 SURGICAL SUPPLY — 25 items
ADH SKN CLS APL DERMABOND .7 (GAUZE/BANDAGES/DRESSINGS) ×3
CABLE HIGH FREQUENCY MONO STRZ (ELECTRODE) ×2 IMPLANT
CATH ROBINSON RED A/P 16FR (CATHETERS) ×4 IMPLANT
CHLORAPREP W/TINT 26ML (MISCELLANEOUS) ×6 IMPLANT
CLOTH BEACON ORANGE TIMEOUT ST (SAFETY) ×4 IMPLANT
DERMABOND ADVANCED (GAUZE/BANDAGES/DRESSINGS) ×1
DERMABOND ADVANCED .7 DNX12 (GAUZE/BANDAGES/DRESSINGS) ×3 IMPLANT
GLOVE BIO SURGEON STRL SZ 6.5 (GLOVE) ×4 IMPLANT
GLOVE BIOGEL PI IND STRL 7.0 (GLOVE) ×6 IMPLANT
GLOVE BIOGEL PI INDICATOR 7.0 (GLOVE) ×4
GLOVE ECLIPSE 6.5 STRL STRAW (GLOVE) ×8 IMPLANT
GLOVE SURG SS PI 7.0 STRL IVOR (GLOVE) ×8 IMPLANT
GOWN STRL REUS W/TWL LRG LVL3 (GOWN DISPOSABLE) ×8 IMPLANT
NS IRRIG 1000ML POUR BTL (IV SOLUTION) ×4 IMPLANT
PACK LAPAROSCOPY BASIN (CUSTOM PROCEDURE TRAY) ×4 IMPLANT
PROTECTOR NERVE ULNAR (MISCELLANEOUS) ×4 IMPLANT
SUT VIC AB 3-0 PS2 18 (SUTURE) ×4
SUT VIC AB 3-0 PS2 18XBRD (SUTURE) ×3 IMPLANT
SUT VICRYL 0 UR6 27IN ABS (SUTURE) ×4 IMPLANT
TOWEL OR 17X24 6PK STRL BLUE (TOWEL DISPOSABLE) ×8 IMPLANT
TROCAR OPTI TIP 5M 100M (ENDOMECHANICALS) ×4 IMPLANT
TROCAR XCEL NON-BLD 11X100MML (ENDOMECHANICALS) ×4 IMPLANT
TROCAR XCEL OPT SLVE 5M 100M (ENDOMECHANICALS) ×2 IMPLANT
WARMER LAPAROSCOPE (MISCELLANEOUS) ×4 IMPLANT
WATER STERILE IRR 1000ML POUR (IV SOLUTION) ×4 IMPLANT

## 2013-04-09 NOTE — Op Note (Signed)
NAME:  Inocencio HomesMCNEIL, Neha              ACCOUNT NO.:  [id number]  MEDICAL RECORD NO.:  [id number]  LOCATION:  [facility]                          FACILITY:  [facility]  PHYSICIAN:  Zelphia CairoGretchen Ivyonna Hoelzel, MD    DATE OF BIRTH:  1985-08-24  DATE OF PROCEDURE:  04/09/2013 DATE OF DISCHARGE:                              OPERATIVE REPORT   PREOPERATIVE DIAGNOSES: 1. Chronic pelvic pain. 2. Possible ovarian cyst.  POSTOPERATIVE DIAGNOSES: 1. Endometriosis. 2. Bilateral ovarian cyst.  PROCEDURE: 1. Diagnostic laparoscopy. 2. Fulguration of endometriosis. 3. Drainage of less than 1 cm functional ovarian cyst bilaterally.  SURGEON:  Zelphia CairoGretchen Larkin Morelos, MD.  ANESTHESIA:  General.  COMPLICATIONS:  None.  SPECIMENS:  None.  CONDITION:  Stable to recovery room.  DESCRIPTION OF PROCEDURE:  The patient was taken to the operating room, where general anesthesia was obtained.  She was prepped and draped in sterile fashion and an in- and out catheter was used to drain her bladder.  Bivalve speculum was placed in the vagina and a single-tooth tenaculum attached to the anterior lip of the cervix.  Hulka clamp was placed.  Single-tooth tenaculum and speculum were removed and our attention was turned to the abdomen.  A 0.25% Marcaine was used to provide local anesthesia at the site of the infraumbilical and suprapubic incision.  Incision was made with a scalpel and extended bluntly to the peritoneum using a Kelly clamp. Optical trocar was inserted under direct visualization.  Once intraperitoneal placement was confirmed, CO2 was turned on and the abdomen and pelvis were insufflated.  A survey revealed a normal- appearing right upper quadrant and appendix.  Uterus, bilateral fallopian tubes appeared normal.  She had a small functional cyst on bilateral ovaries.  She had a small potential implant of endometriosis in the right posterior cul-de-sac.  She had what appeared to be possible endometriosis on the descending  colon.  The left ovary was grasped and tented upwards with atraumatic graspers.  Small incision was made over the cyst and drained for a scant amount of fluid.  Cautery was cyst based and edges were cauterized extensively.  A small functional cyst on the right ovary was also drained.  I had a low suspicion that these cysts were the cause of her pain, however, wanted to be complete in her treatment and ruling out that they were endometriomas.  Cyst drained serous fluid.  No endometrioma was identified.  Small implant of endometriosis in the posterior cul-de-sac was cauterized.  All instruments and trocars were then removed from the abdomen and pelvis. Gas was released from the abdomen.  A deep stitch was placed in the infraumbilical incision, and skin was reapproximated with Vicryl. Dermabond was placed over both incisions.  The Hulka clamp was removed. The patient was extubated and taken to the recovery room in stable condition.  Sponge, lap, needle, and instrument counts were correct x2.     Zelphia CairoGretchen Nate Common, MD     GA/MEDQ  D:  04/09/2013  T:  04/09/2013  Job:  [id number]

## 2013-04-09 NOTE — Anesthesia Preprocedure Evaluation (Signed)
Anesthesia Evaluation  Patient identified by MRN, date of birth, ID band Patient awake    Reviewed: Allergy & Precautions, H&P , NPO status , Patient's Chart, lab work & pertinent test results  History of Anesthesia Complications Negative for: history of anesthetic complications  Airway Mallampati: I TM Distance: >3 FB Neck ROM: Full    Dental   Pulmonary asthma , neg recent URI,    Pulmonary exam normal       Cardiovascular negative cardio ROS  Rhythm:Regular Rate:Normal     Neuro/Psych negative neurological ROS  negative psych ROS   GI/Hepatic negative GI ROS, Neg liver ROS,   Endo/Other  negative endocrine ROSneg diabetes  Renal/GU negative Renal ROS     Musculoskeletal negative musculoskeletal ROS (+)   Abdominal   Peds  Hematology negative hematology ROS (+)   Anesthesia Other Findings   Reproductive/Obstetrics                           Anesthesia Physical Anesthesia Plan  ASA: I  Anesthesia Plan: General   Post-op Pain Management:    Induction: Intravenous  Airway Management Planned: Oral ETT  Additional Equipment: None  Intra-op Plan:   Post-operative Plan: Extubation in OR  Informed Consent: I have reviewed the patients History and Physical, chart, labs and discussed the procedure including the risks, benefits and alternatives for the proposed anesthesia with the patient or authorized representative who has indicated his/her understanding and acceptance.   Dental advisory given  Plan Discussed with: CRNA and Surgeon  Anesthesia Plan Comments:         Anesthesia Quick Evaluation

## 2013-04-09 NOTE — Discharge Instructions (Signed)
DISCHARGE INSTRUCTIONS: Laparoscopy  MAY TAKE IBUPROFEN (ADVIL, MOTRIN) OR ALEVE AFTER 2:00 PM FOR CRAMPS!!!!  The following instructions have been prepared to help you care for yourself upon your return home today.  Wound care:  Do not get the incision wet for the first 24 hours. The incision should be kept clean and dry.  The Band-Aids or dressings may be removed the day after surgery.  Should the incision become sore, red, and swollen after the first week, check with your doctor.  Personal hygiene:  Shower the day after your procedure.  Activity and limitations:  Do NOT drive or operate any equipment today.  Do NOT lift anything more than 15 pounds for 2-3 weeks after surgery.  Do NOT rest in bed all day.  Walking is encouraged. Walk each day, starting slowly with 5-minute walks 3 or 4 times a day. Slowly increase the length of your walks.  Walk up and down stairs slowly.  Do NOT do strenuous activities, such as golfing, playing tennis, bowling, running, biking, weight lifting, gardening, mowing, or vacuuming for 2-4 weeks. Ask your doctor when it is okay to start.  Diet: Eat a light meal as desired this evening. You may resume your usual diet tomorrow.  Return to work: This is dependent on the type of work you do. For the most part you can return to a desk job within a week of surgery. If you are more active at work, please discuss this with your doctor.  What to expect after your surgery: You may have a slight burning sensation when you urinate on the first day. You may have a very small amount of blood in the urine. Expect to have a small amount of vaginal discharge/light bleeding for 1-2 weeks. It is not unusual to have abdominal soreness and bruising for up to 2 weeks. You may be tired and need more rest for about 1 week. You may experience shoulder pain for 24-72 hours. Lying flat in bed may relieve it.  Call your doctor for any of the following:  Develop a fever of  100.4 or greater  Inability to urinate 6 hours after discharge from hospital  Severe pain not relieved by pain medications  Persistent of heavy bleeding at incision site  Redness or swelling around incision site after a week  Increasing nausea or vomiting  Patient Signature________________________________________ Nurse Signature_________________________________________

## 2013-04-09 NOTE — Anesthesia Postprocedure Evaluation (Signed)
  Anesthesia Post-op Note  Patient: Destiny Simpson  Procedure(s) Performed: Procedure(s): OVARIAN CYST drainage  (Bilateral) ABLATION ON ENDOMETRIOSIS (N/A)  Patient Location: PACU  Anesthesia Type:General  Level of Consciousness: awake, alert  and oriented  Airway and Oxygen Therapy: Patient Spontanous Breathing  Post-op Pain: mild  Post-op Assessment: Post-op Vital signs reviewed, Patient's Cardiovascular Status Stable, Respiratory Function Stable, Patent Airway, No signs of Nausea or vomiting and Pain level controlled  Post-op Vital Signs: Reviewed and stable  Complications: No apparent anesthesia complications

## 2013-04-09 NOTE — Transfer of Care (Signed)
Immediate Anesthesia Transfer of Care Note  Patient: Destiny Simpson  Procedure(s) Performed: Procedure(s): OVARIAN CYST drainage  (Bilateral) ABLATION ON ENDOMETRIOSIS (N/A)  Patient Location: PACU  Anesthesia Type:General  Level of Consciousness: awake, oriented, sedated and patient cooperative  Airway & Oxygen Therapy: Patient Spontanous Breathing and Patient connected to nasal cannula oxygen  Post-op Assessment: Report given to PACU RN and Post -op Vital signs reviewed and stable  Post vital signs: Reviewed and stable  Complications: No apparent anesthesia complications

## 2013-04-10 ENCOUNTER — Encounter (HOSPITAL_COMMUNITY): Payer: Self-pay | Admitting: Obstetrics and Gynecology

## 2013-07-20 IMAGING — CR DG CHEST 2V
2 series · 2 of 2 positions shown · non-contrast
Comparison: None.

CLINICAL DATA: Pain post trauma

CHEST - 2 VIEW

[w chest pa]
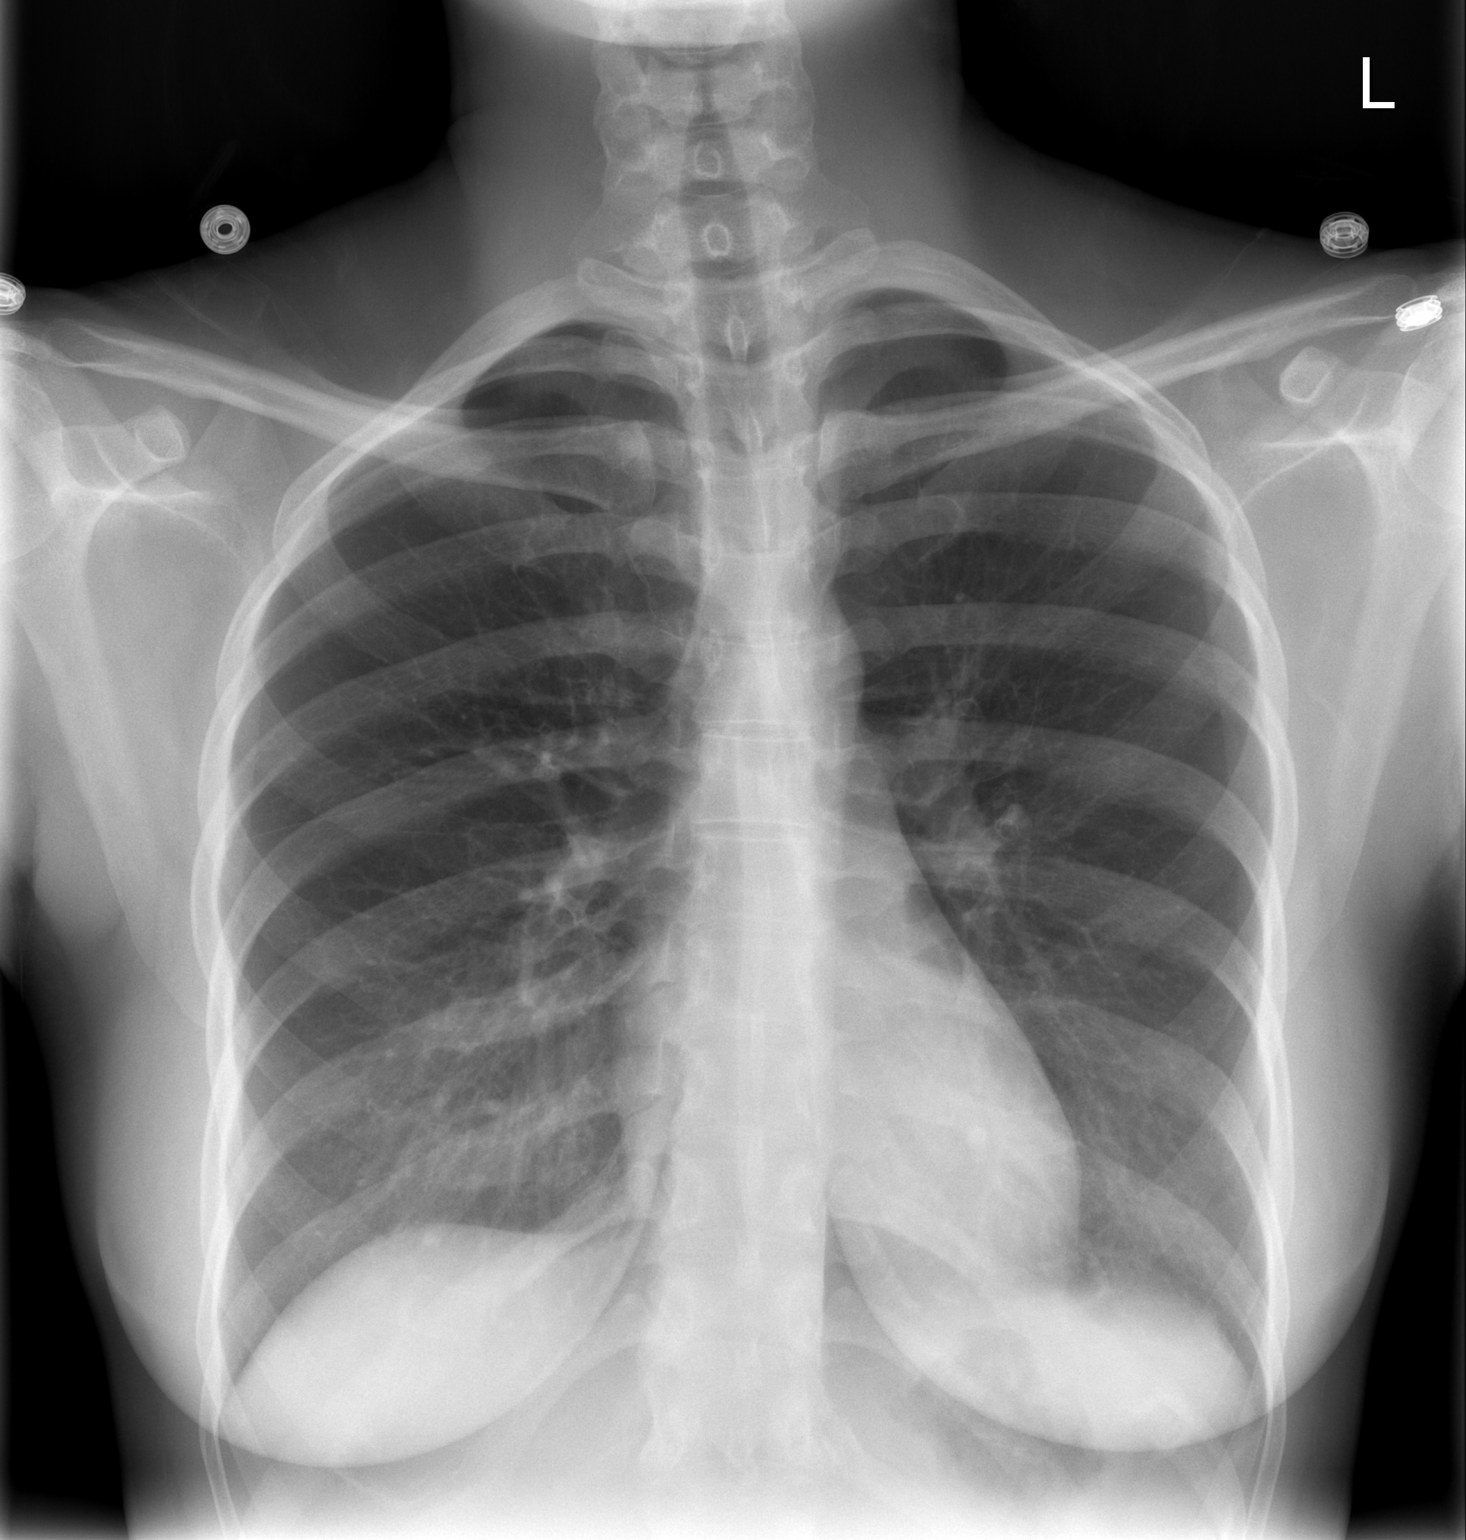

[w chest lat]
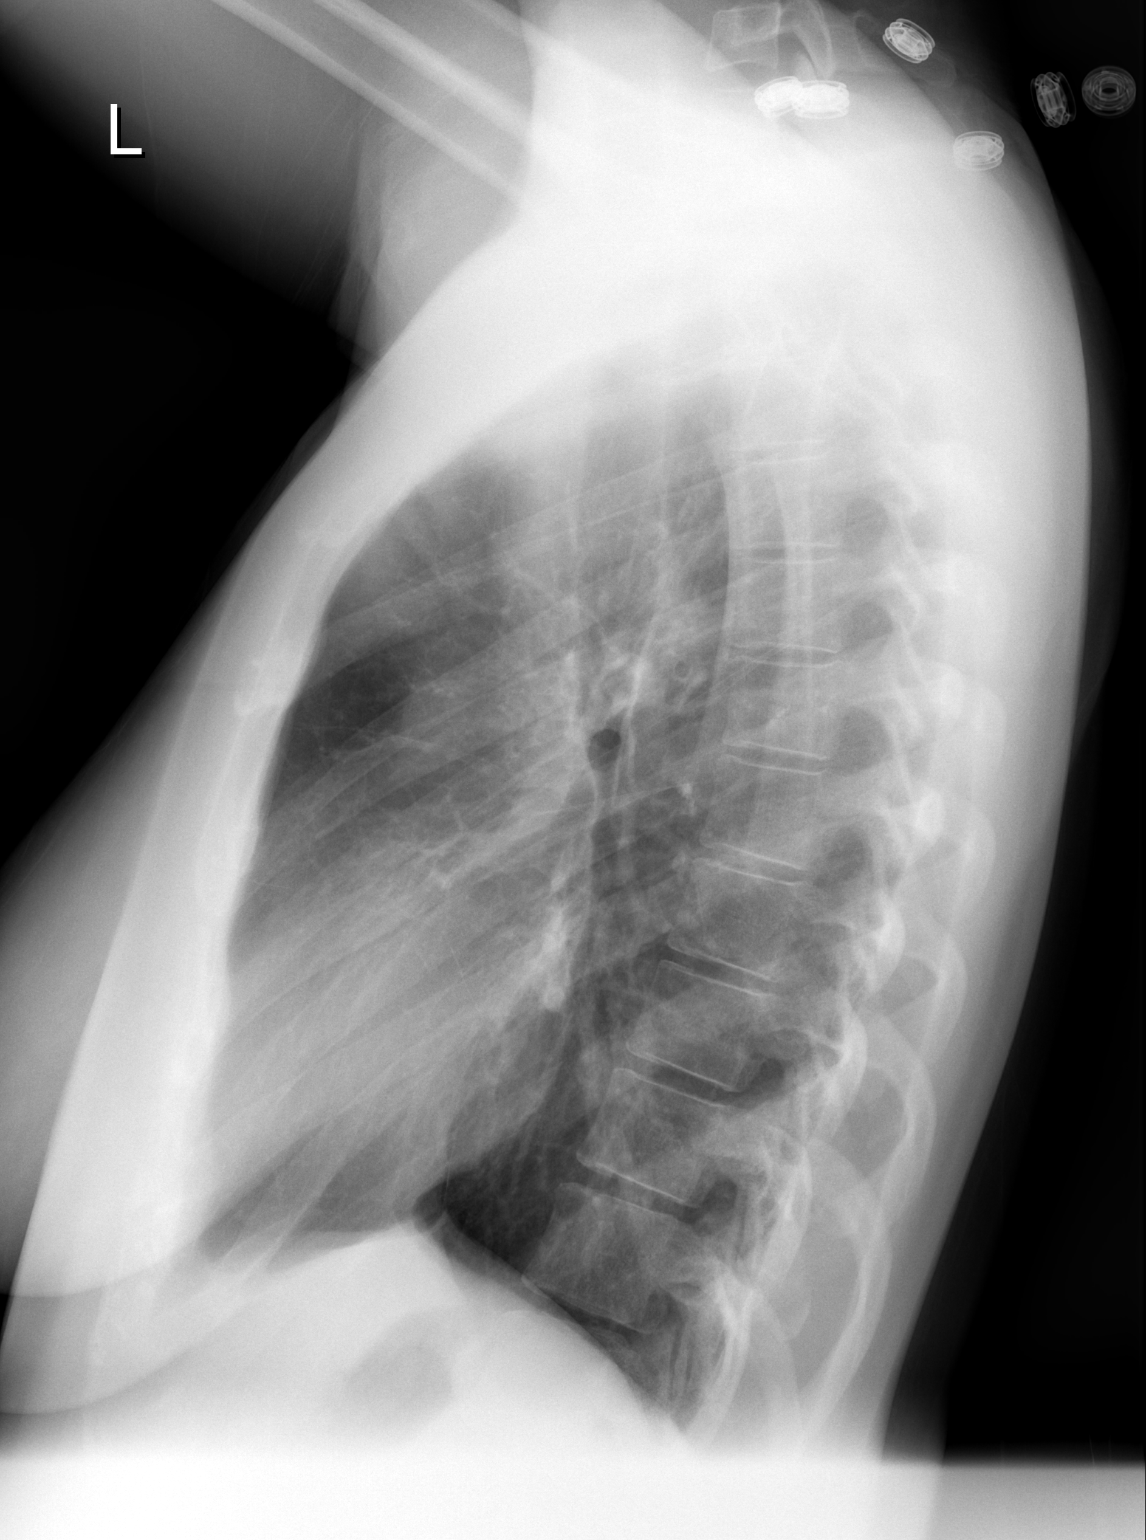

[2 of 2 positions shown; findings below may reference images not displayed]

FINDINGS: Lungs clear.  Heart size and pulmonary vascularity are
normal.  No adenopathy.  No bone lesions.  No pneumothorax.
IMPRESSION: No abnormality noted.

## 2013-12-14 ENCOUNTER — Encounter (HOSPITAL_COMMUNITY): Payer: Self-pay | Admitting: Obstetrics and Gynecology

## 2014-04-09 IMAGING — US US PELVIS COMPLETE
1 series · 14 of 25 positions shown · non-contrast
Comparison: None

CLINICAL DATA: Left pelvic pain, IUD since 0579

EXAM:
TRANSABDOMINAL AND TRANSVAGINAL ULTRASOUND OF PELVIS
TECHNIQUE: Both transabdominal and transvaginal ultrasound examinations of the
pelvis were performed. Transabdominal technique was performed for
global imaging of the pelvis including uterus, ovaries, adnexal
regions, and pelvic cul-de-sac. It was necessary to proceed with
endovaginal exam following the transabdominal exam to visualize the
bilateral ovaries.

[Series 1: us pelvis complete · 14 of 65 slices shown]
[im 1/65]
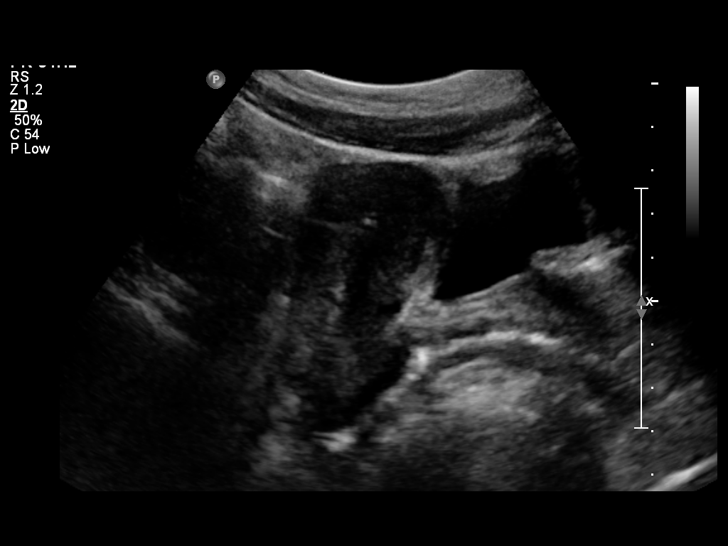
[im 6/65]
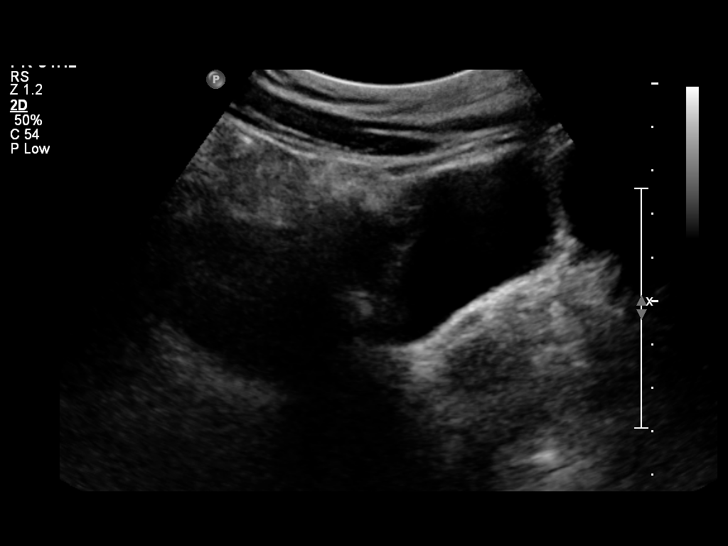
[im 11/65]
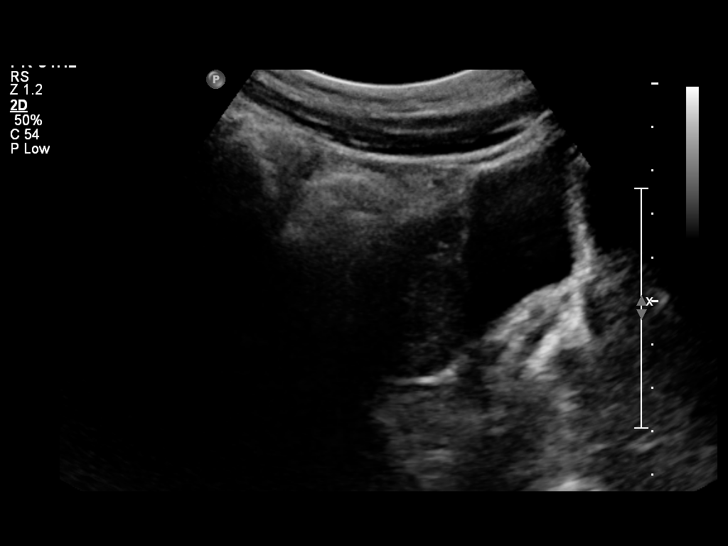
[im 17/65]
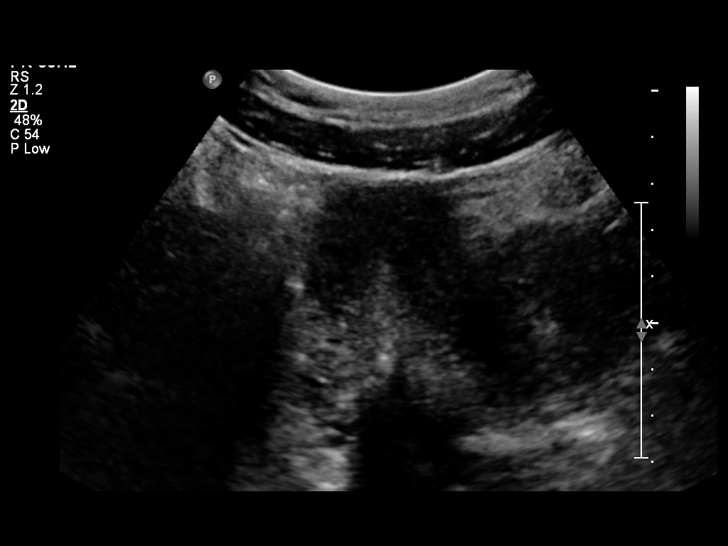
[im 22/65]
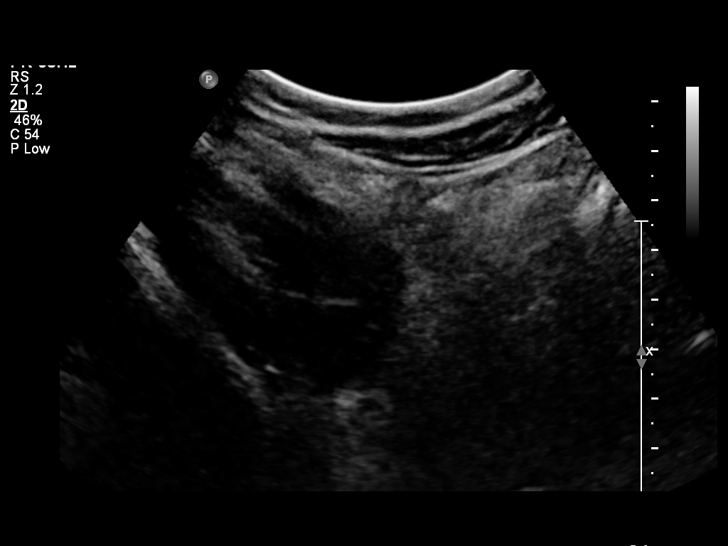
[im 25/65]
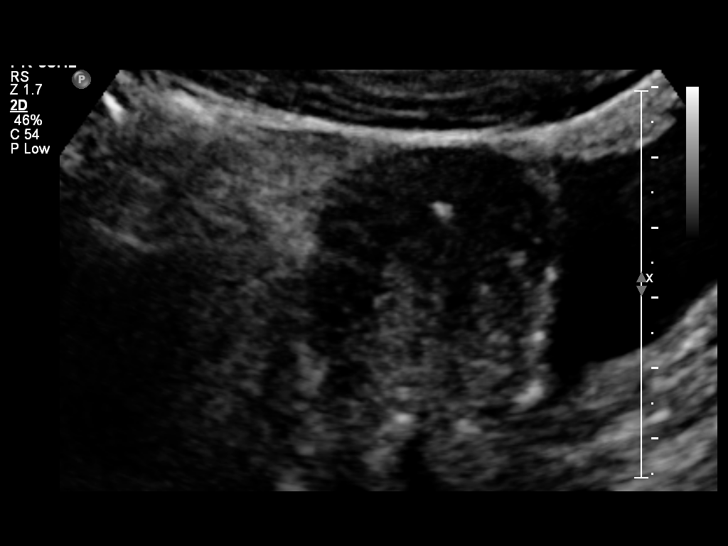
[im 30/65]
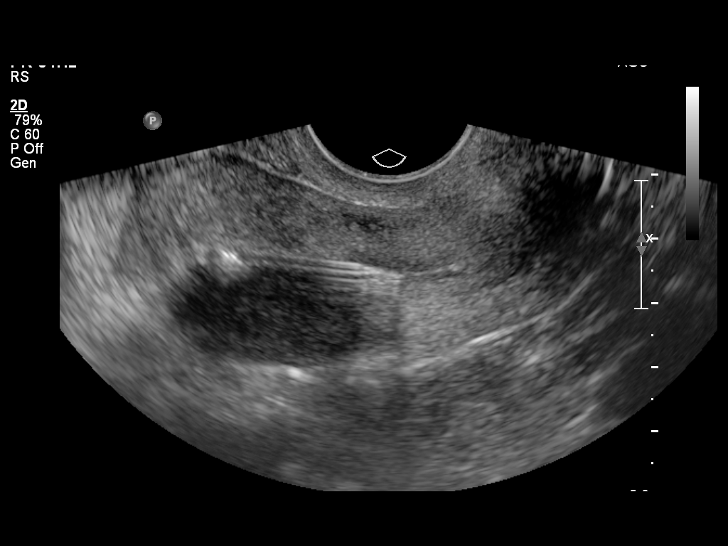
[im 35/65]
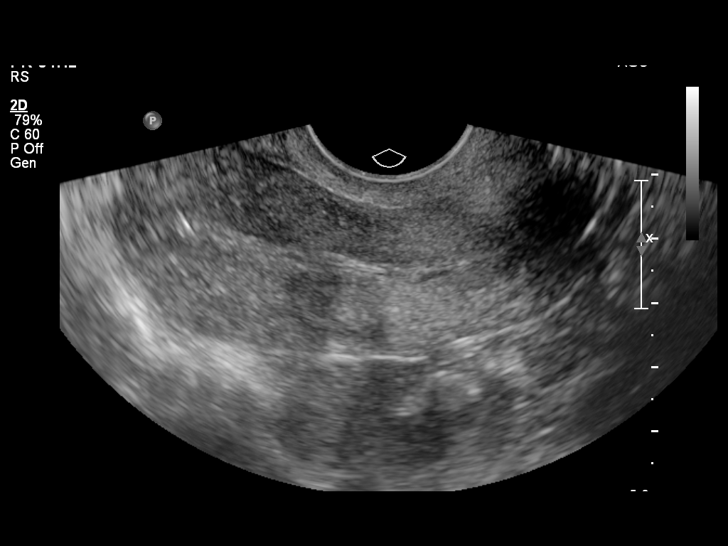
[im 41/65]
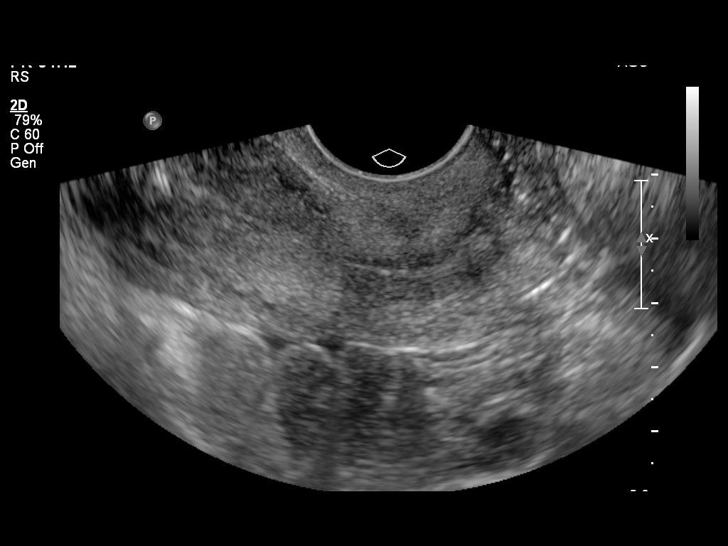
[im 43/65]
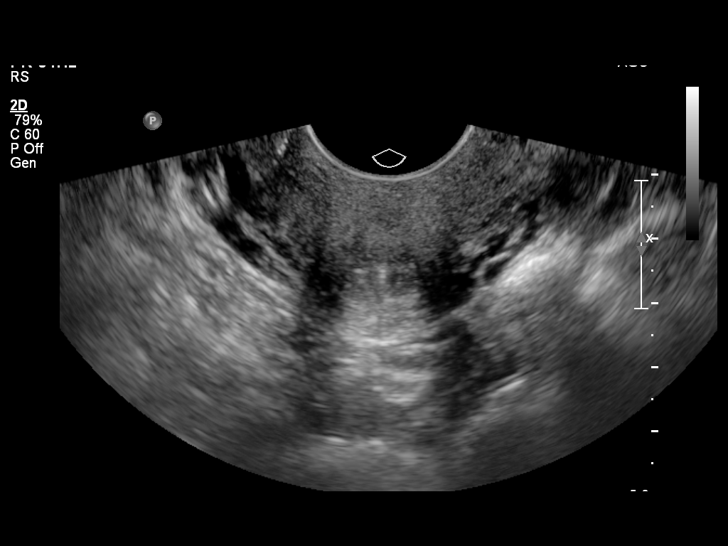
[im 49/65]
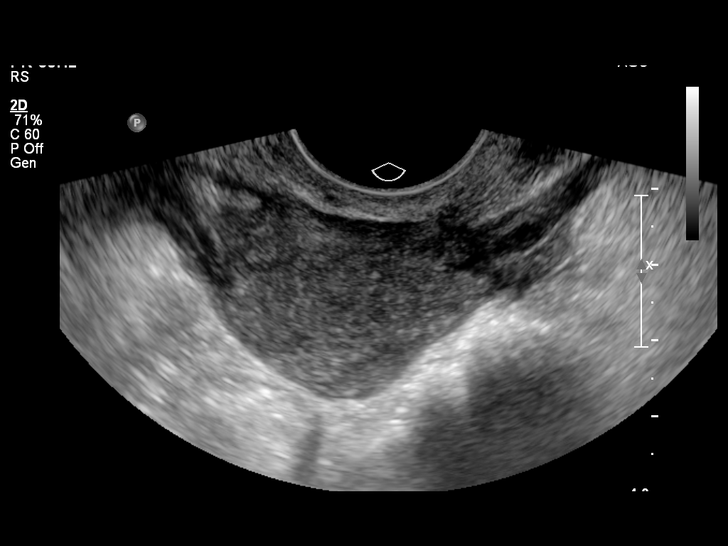
[im 54/65]
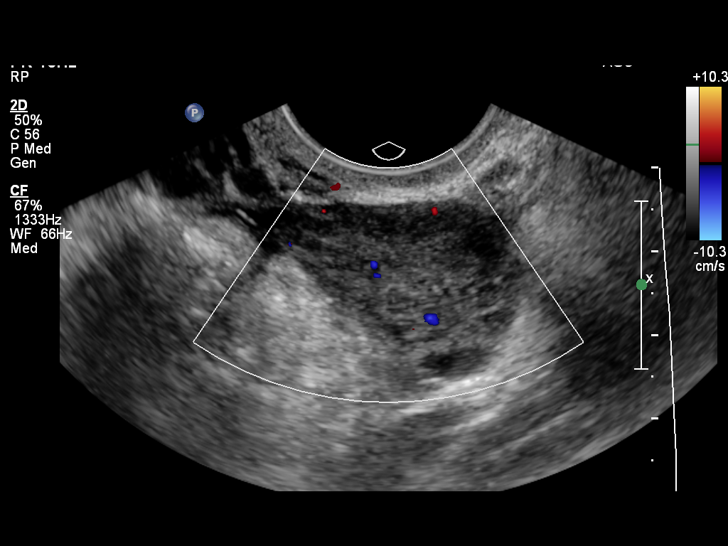
[im 59/65]
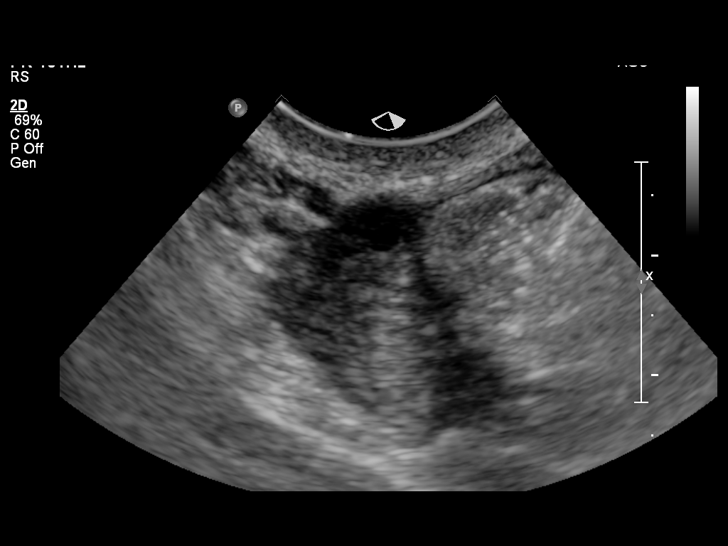
[im 65/65]
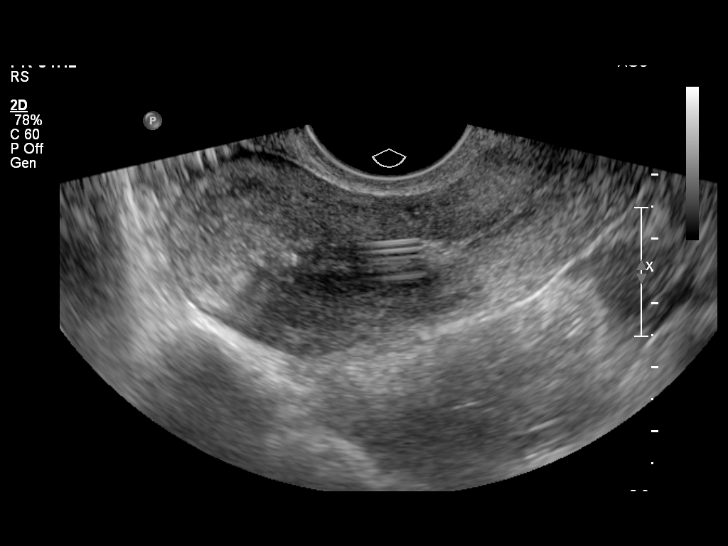

[14 of 25 positions shown; findings below may reference images not displayed]

FINDINGS: Uterus

Measurements: 8.1 x 3.1 x 3.9 cm. No fibroids or other mass
visualized.

Endometrium

Thickness: 5 mm.  IUD in satisfactory position.

Right ovary

Measurements: 3.9 x 1.7 x 1.4 cm. Normal appearance/no adnexal mass.

Left ovary

Measurements: 4.6 x 2.1 x 2.6 cm. Normal appearance/no adnexal mass.
Probable 1.8 cm hemorrhagic corpus luteal cyst.

Other findings

No free fluid.
IMPRESSION: IUD in satisfactory position.

1.8 cm hemorrhagic left corpus luteal cyst.

## 2015-08-22 DIAGNOSIS — L509 Urticaria, unspecified: Secondary | ICD-10-CM | POA: Diagnosis not present

## 2015-08-22 DIAGNOSIS — L2084 Intrinsic (allergic) eczema: Secondary | ICD-10-CM | POA: Diagnosis not present

## 2015-08-22 DIAGNOSIS — L259 Unspecified contact dermatitis, unspecified cause: Secondary | ICD-10-CM | POA: Diagnosis not present

## 2015-11-02 DIAGNOSIS — Z309 Encounter for contraceptive management, unspecified: Secondary | ICD-10-CM | POA: Diagnosis not present

## 2015-11-02 DIAGNOSIS — Z01419 Encounter for gynecological examination (general) (routine) without abnormal findings: Secondary | ICD-10-CM | POA: Diagnosis not present

## 2015-11-02 DIAGNOSIS — Z202 Contact with and (suspected) exposure to infections with a predominantly sexual mode of transmission: Secondary | ICD-10-CM | POA: Diagnosis not present

## 2015-11-02 DIAGNOSIS — Z6822 Body mass index (BMI) 22.0-22.9, adult: Secondary | ICD-10-CM | POA: Diagnosis not present

## 2015-12-14 DIAGNOSIS — Z538 Procedure and treatment not carried out for other reasons: Secondary | ICD-10-CM | POA: Diagnosis not present

## 2015-12-14 DIAGNOSIS — Z975 Presence of (intrauterine) contraceptive device: Secondary | ICD-10-CM | POA: Diagnosis not present

## 2015-12-14 DIAGNOSIS — Z01812 Encounter for preprocedural laboratory examination: Secondary | ICD-10-CM | POA: Diagnosis not present

## 2016-01-23 DIAGNOSIS — Z30433 Encounter for removal and reinsertion of intrauterine contraceptive device: Secondary | ICD-10-CM | POA: Diagnosis not present

## 2016-01-23 DIAGNOSIS — T8332XD Displacement of intrauterine contraceptive device, subsequent encounter: Secondary | ICD-10-CM | POA: Diagnosis not present

## 2016-02-28 DIAGNOSIS — Z30431 Encounter for routine checking of intrauterine contraceptive device: Secondary | ICD-10-CM | POA: Diagnosis not present

## 2016-02-28 DIAGNOSIS — Z309 Encounter for contraceptive management, unspecified: Secondary | ICD-10-CM | POA: Diagnosis not present

## 2016-02-28 DIAGNOSIS — Z975 Presence of (intrauterine) contraceptive device: Secondary | ICD-10-CM | POA: Diagnosis not present

## 2016-04-12 DIAGNOSIS — N83201 Unspecified ovarian cyst, right side: Secondary | ICD-10-CM | POA: Diagnosis not present

## 2016-04-12 DIAGNOSIS — R102 Pelvic and perineal pain: Secondary | ICD-10-CM | POA: Diagnosis not present

## 2016-04-12 DIAGNOSIS — N898 Other specified noninflammatory disorders of vagina: Secondary | ICD-10-CM | POA: Diagnosis not present

## 2016-04-12 DIAGNOSIS — N760 Acute vaginitis: Secondary | ICD-10-CM | POA: Diagnosis not present

## 2016-04-12 DIAGNOSIS — Z975 Presence of (intrauterine) contraceptive device: Secondary | ICD-10-CM | POA: Diagnosis not present

## 2016-05-28 DIAGNOSIS — T1490XA Injury, unspecified, initial encounter: Secondary | ICD-10-CM | POA: Diagnosis not present

## 2016-05-28 DIAGNOSIS — L309 Dermatitis, unspecified: Secondary | ICD-10-CM | POA: Diagnosis not present

## 2016-05-28 DIAGNOSIS — Z6823 Body mass index (BMI) 23.0-23.9, adult: Secondary | ICD-10-CM | POA: Diagnosis not present

## 2017-01-17 DIAGNOSIS — Z13220 Encounter for screening for lipoid disorders: Secondary | ICD-10-CM | POA: Diagnosis not present

## 2017-01-17 DIAGNOSIS — Z1329 Encounter for screening for other suspected endocrine disorder: Secondary | ICD-10-CM | POA: Diagnosis not present

## 2017-01-17 DIAGNOSIS — Z0000 Encounter for general adult medical examination without abnormal findings: Secondary | ICD-10-CM | POA: Diagnosis not present

## 2017-01-17 DIAGNOSIS — Z114 Encounter for screening for human immunodeficiency virus [HIV]: Secondary | ICD-10-CM | POA: Diagnosis not present

## 2017-01-18 DIAGNOSIS — Z0000 Encounter for general adult medical examination without abnormal findings: Secondary | ICD-10-CM | POA: Diagnosis not present

## 2017-01-18 DIAGNOSIS — Z6823 Body mass index (BMI) 23.0-23.9, adult: Secondary | ICD-10-CM | POA: Diagnosis not present

## 2017-05-12 DIAGNOSIS — Z0100 Encounter for examination of eyes and vision without abnormal findings: Secondary | ICD-10-CM | POA: Diagnosis not present

## 2017-10-01 DIAGNOSIS — L309 Dermatitis, unspecified: Secondary | ICD-10-CM | POA: Diagnosis not present

## 2017-11-27 DIAGNOSIS — R1032 Left lower quadrant pain: Secondary | ICD-10-CM | POA: Diagnosis not present

## 2017-11-27 DIAGNOSIS — R102 Pelvic and perineal pain: Secondary | ICD-10-CM | POA: Diagnosis not present

## 2017-11-28 DIAGNOSIS — R102 Pelvic and perineal pain: Secondary | ICD-10-CM | POA: Diagnosis not present

## 2017-12-17 DIAGNOSIS — Z124 Encounter for screening for malignant neoplasm of cervix: Secondary | ICD-10-CM | POA: Diagnosis not present

## 2017-12-17 DIAGNOSIS — Z6823 Body mass index (BMI) 23.0-23.9, adult: Secondary | ICD-10-CM | POA: Diagnosis not present

## 2017-12-17 DIAGNOSIS — Z01419 Encounter for gynecological examination (general) (routine) without abnormal findings: Secondary | ICD-10-CM | POA: Diagnosis not present

## 2017-12-17 DIAGNOSIS — Z113 Encounter for screening for infections with a predominantly sexual mode of transmission: Secondary | ICD-10-CM | POA: Diagnosis not present

## 2017-12-26 DIAGNOSIS — R102 Pelvic and perineal pain: Secondary | ICD-10-CM | POA: Diagnosis not present

## 2018-02-27 DIAGNOSIS — Z1329 Encounter for screening for other suspected endocrine disorder: Secondary | ICD-10-CM | POA: Diagnosis not present

## 2018-02-27 DIAGNOSIS — Z13220 Encounter for screening for lipoid disorders: Secondary | ICD-10-CM | POA: Diagnosis not present

## 2018-02-27 DIAGNOSIS — Z114 Encounter for screening for human immunodeficiency virus [HIV]: Secondary | ICD-10-CM | POA: Diagnosis not present

## 2018-02-27 DIAGNOSIS — Z0000 Encounter for general adult medical examination without abnormal findings: Secondary | ICD-10-CM | POA: Diagnosis not present

## 2018-03-04 DIAGNOSIS — Z0000 Encounter for general adult medical examination without abnormal findings: Secondary | ICD-10-CM | POA: Diagnosis not present

## 2018-03-04 DIAGNOSIS — Z6824 Body mass index (BMI) 24.0-24.9, adult: Secondary | ICD-10-CM | POA: Diagnosis not present

## 2018-04-10 DIAGNOSIS — J00 Acute nasopharyngitis [common cold]: Secondary | ICD-10-CM | POA: Diagnosis not present

## 2018-04-10 DIAGNOSIS — Z6823 Body mass index (BMI) 23.0-23.9, adult: Secondary | ICD-10-CM | POA: Diagnosis not present

## 2018-06-03 DIAGNOSIS — Z0100 Encounter for examination of eyes and vision without abnormal findings: Secondary | ICD-10-CM | POA: Diagnosis not present

## 2018-07-15 DIAGNOSIS — F419 Anxiety disorder, unspecified: Secondary | ICD-10-CM | POA: Diagnosis not present

## 2018-07-21 DIAGNOSIS — F419 Anxiety disorder, unspecified: Secondary | ICD-10-CM | POA: Diagnosis not present

## 2018-07-29 DIAGNOSIS — F419 Anxiety disorder, unspecified: Secondary | ICD-10-CM | POA: Diagnosis not present

## 2018-08-05 DIAGNOSIS — F419 Anxiety disorder, unspecified: Secondary | ICD-10-CM | POA: Diagnosis not present

## 2018-08-12 DIAGNOSIS — F419 Anxiety disorder, unspecified: Secondary | ICD-10-CM | POA: Diagnosis not present

## 2018-08-20 DIAGNOSIS — F419 Anxiety disorder, unspecified: Secondary | ICD-10-CM | POA: Diagnosis not present

## 2018-08-26 DIAGNOSIS — F419 Anxiety disorder, unspecified: Secondary | ICD-10-CM | POA: Diagnosis not present

## 2018-09-02 DIAGNOSIS — F419 Anxiety disorder, unspecified: Secondary | ICD-10-CM | POA: Diagnosis not present

## 2018-09-10 DIAGNOSIS — F419 Anxiety disorder, unspecified: Secondary | ICD-10-CM | POA: Diagnosis not present

## 2018-09-16 DIAGNOSIS — F419 Anxiety disorder, unspecified: Secondary | ICD-10-CM | POA: Diagnosis not present

## 2018-09-17 DIAGNOSIS — L309 Dermatitis, unspecified: Secondary | ICD-10-CM | POA: Diagnosis not present

## 2018-10-08 DIAGNOSIS — F419 Anxiety disorder, unspecified: Secondary | ICD-10-CM | POA: Diagnosis not present

## 2018-10-25 DIAGNOSIS — Z975 Presence of (intrauterine) contraceptive device: Secondary | ICD-10-CM | POA: Diagnosis not present

## 2018-10-25 DIAGNOSIS — R1032 Left lower quadrant pain: Secondary | ICD-10-CM | POA: Diagnosis not present

## 2018-10-25 DIAGNOSIS — Z888 Allergy status to other drugs, medicaments and biological substances status: Secondary | ICD-10-CM | POA: Diagnosis not present

## 2018-10-25 DIAGNOSIS — N83202 Unspecified ovarian cyst, left side: Secondary | ICD-10-CM | POA: Diagnosis not present

## 2018-10-29 DIAGNOSIS — F419 Anxiety disorder, unspecified: Secondary | ICD-10-CM | POA: Diagnosis not present

## 2018-11-03 DIAGNOSIS — Z975 Presence of (intrauterine) contraceptive device: Secondary | ICD-10-CM | POA: Diagnosis not present

## 2018-11-03 DIAGNOSIS — Z30432 Encounter for removal of intrauterine contraceptive device: Secondary | ICD-10-CM | POA: Diagnosis not present

## 2018-11-03 DIAGNOSIS — R102 Pelvic and perineal pain: Secondary | ICD-10-CM | POA: Diagnosis not present

## 2018-11-05 DIAGNOSIS — F419 Anxiety disorder, unspecified: Secondary | ICD-10-CM | POA: Diagnosis not present

## 2018-11-12 DIAGNOSIS — F419 Anxiety disorder, unspecified: Secondary | ICD-10-CM | POA: Diagnosis not present

## 2018-11-19 DIAGNOSIS — F419 Anxiety disorder, unspecified: Secondary | ICD-10-CM | POA: Diagnosis not present

## 2018-12-03 DIAGNOSIS — F419 Anxiety disorder, unspecified: Secondary | ICD-10-CM | POA: Diagnosis not present

## 2018-12-10 DIAGNOSIS — F419 Anxiety disorder, unspecified: Secondary | ICD-10-CM | POA: Diagnosis not present

## 2018-12-19 DIAGNOSIS — Z8742 Personal history of other diseases of the female genital tract: Secondary | ICD-10-CM | POA: Diagnosis not present

## 2018-12-19 DIAGNOSIS — Z309 Encounter for contraceptive management, unspecified: Secondary | ICD-10-CM | POA: Diagnosis not present

## 2018-12-19 DIAGNOSIS — Z01419 Encounter for gynecological examination (general) (routine) without abnormal findings: Secondary | ICD-10-CM | POA: Diagnosis not present

## 2018-12-19 DIAGNOSIS — Z113 Encounter for screening for infections with a predominantly sexual mode of transmission: Secondary | ICD-10-CM | POA: Diagnosis not present

## 2018-12-24 DIAGNOSIS — F419 Anxiety disorder, unspecified: Secondary | ICD-10-CM | POA: Diagnosis not present

## 2018-12-31 DIAGNOSIS — F419 Anxiety disorder, unspecified: Secondary | ICD-10-CM | POA: Diagnosis not present

## 2019-01-14 DIAGNOSIS — F419 Anxiety disorder, unspecified: Secondary | ICD-10-CM | POA: Diagnosis not present

## 2019-01-21 DIAGNOSIS — F419 Anxiety disorder, unspecified: Secondary | ICD-10-CM | POA: Diagnosis not present

## 2019-02-02 DIAGNOSIS — F419 Anxiety disorder, unspecified: Secondary | ICD-10-CM | POA: Diagnosis not present

## 2019-02-18 DIAGNOSIS — F419 Anxiety disorder, unspecified: Secondary | ICD-10-CM | POA: Diagnosis not present

## 2019-02-25 DIAGNOSIS — F419 Anxiety disorder, unspecified: Secondary | ICD-10-CM | POA: Diagnosis not present

## 2019-03-11 DIAGNOSIS — F419 Anxiety disorder, unspecified: Secondary | ICD-10-CM | POA: Diagnosis not present

## 2019-03-18 DIAGNOSIS — F419 Anxiety disorder, unspecified: Secondary | ICD-10-CM | POA: Diagnosis not present

## 2019-03-27 DIAGNOSIS — F419 Anxiety disorder, unspecified: Secondary | ICD-10-CM | POA: Diagnosis not present

## 2019-04-13 DIAGNOSIS — M545 Low back pain: Secondary | ICD-10-CM | POA: Diagnosis not present

## 2019-04-13 DIAGNOSIS — M791 Myalgia, unspecified site: Secondary | ICD-10-CM | POA: Diagnosis not present

## 2019-04-13 DIAGNOSIS — M9904 Segmental and somatic dysfunction of sacral region: Secondary | ICD-10-CM | POA: Diagnosis not present

## 2019-04-13 DIAGNOSIS — M9903 Segmental and somatic dysfunction of lumbar region: Secondary | ICD-10-CM | POA: Diagnosis not present

## 2019-04-15 DIAGNOSIS — F419 Anxiety disorder, unspecified: Secondary | ICD-10-CM | POA: Diagnosis not present

## 2019-04-29 DIAGNOSIS — F419 Anxiety disorder, unspecified: Secondary | ICD-10-CM | POA: Diagnosis not present

## 2019-05-20 DIAGNOSIS — F419 Anxiety disorder, unspecified: Secondary | ICD-10-CM | POA: Diagnosis not present

## 2019-05-30 DIAGNOSIS — G44209 Tension-type headache, unspecified, not intractable: Secondary | ICD-10-CM | POA: Diagnosis not present

## 2019-06-02 DIAGNOSIS — Z411 Encounter for cosmetic surgery: Secondary | ICD-10-CM | POA: Diagnosis not present

## 2019-06-02 DIAGNOSIS — L2084 Intrinsic (allergic) eczema: Secondary | ICD-10-CM | POA: Diagnosis not present

## 2019-06-02 DIAGNOSIS — L739 Follicular disorder, unspecified: Secondary | ICD-10-CM | POA: Diagnosis not present

## 2019-06-10 DIAGNOSIS — F419 Anxiety disorder, unspecified: Secondary | ICD-10-CM | POA: Diagnosis not present

## 2019-06-24 DIAGNOSIS — F419 Anxiety disorder, unspecified: Secondary | ICD-10-CM | POA: Diagnosis not present

## 2019-07-01 DIAGNOSIS — F419 Anxiety disorder, unspecified: Secondary | ICD-10-CM | POA: Diagnosis not present

## 2019-07-08 DIAGNOSIS — F419 Anxiety disorder, unspecified: Secondary | ICD-10-CM | POA: Diagnosis not present

## 2019-07-15 DIAGNOSIS — F419 Anxiety disorder, unspecified: Secondary | ICD-10-CM | POA: Diagnosis not present

## 2019-08-26 DIAGNOSIS — F419 Anxiety disorder, unspecified: Secondary | ICD-10-CM | POA: Diagnosis not present

## 2019-09-09 DIAGNOSIS — F419 Anxiety disorder, unspecified: Secondary | ICD-10-CM | POA: Diagnosis not present

## 2019-11-04 DIAGNOSIS — F419 Anxiety disorder, unspecified: Secondary | ICD-10-CM | POA: Diagnosis not present

## 2019-11-08 DIAGNOSIS — Z20828 Contact with and (suspected) exposure to other viral communicable diseases: Secondary | ICD-10-CM | POA: Diagnosis not present

## 2019-11-10 DIAGNOSIS — Z20828 Contact with and (suspected) exposure to other viral communicable diseases: Secondary | ICD-10-CM | POA: Diagnosis not present

## 2019-11-18 DIAGNOSIS — F419 Anxiety disorder, unspecified: Secondary | ICD-10-CM | POA: Diagnosis not present

## 2019-12-02 DIAGNOSIS — F419 Anxiety disorder, unspecified: Secondary | ICD-10-CM | POA: Diagnosis not present

## 2019-12-16 DIAGNOSIS — L739 Follicular disorder, unspecified: Secondary | ICD-10-CM | POA: Diagnosis not present

## 2019-12-16 DIAGNOSIS — L810 Postinflammatory hyperpigmentation: Secondary | ICD-10-CM | POA: Diagnosis not present

## 2019-12-24 DIAGNOSIS — L309 Dermatitis, unspecified: Secondary | ICD-10-CM | POA: Diagnosis not present

## 2019-12-24 DIAGNOSIS — R519 Headache, unspecified: Secondary | ICD-10-CM | POA: Diagnosis not present

## 2019-12-24 DIAGNOSIS — T50B95A Adverse effect of other viral vaccines, initial encounter: Secondary | ICD-10-CM | POA: Diagnosis not present

## 2019-12-24 DIAGNOSIS — M7989 Other specified soft tissue disorders: Secondary | ICD-10-CM | POA: Diagnosis not present

## 2019-12-25 DIAGNOSIS — F419 Anxiety disorder, unspecified: Secondary | ICD-10-CM | POA: Diagnosis not present

## 2020-01-05 DIAGNOSIS — F419 Anxiety disorder, unspecified: Secondary | ICD-10-CM | POA: Diagnosis not present

## 2020-01-12 DIAGNOSIS — R42 Dizziness and giddiness: Secondary | ICD-10-CM | POA: Diagnosis not present

## 2020-01-12 DIAGNOSIS — R519 Headache, unspecified: Secondary | ICD-10-CM | POA: Diagnosis not present

## 2020-01-12 DIAGNOSIS — G43109 Migraine with aura, not intractable, without status migrainosus: Secondary | ICD-10-CM | POA: Diagnosis not present

## 2020-01-15 DIAGNOSIS — F419 Anxiety disorder, unspecified: Secondary | ICD-10-CM | POA: Diagnosis not present

## 2020-01-16 DIAGNOSIS — R519 Headache, unspecified: Secondary | ICD-10-CM | POA: Diagnosis not present

## 2020-01-16 DIAGNOSIS — R42 Dizziness and giddiness: Secondary | ICD-10-CM | POA: Diagnosis not present

## 2020-01-19 DIAGNOSIS — L209 Atopic dermatitis, unspecified: Secondary | ICD-10-CM | POA: Diagnosis not present

## 2020-01-19 DIAGNOSIS — L0100 Impetigo, unspecified: Secondary | ICD-10-CM | POA: Diagnosis not present

## 2020-01-20 DIAGNOSIS — N3281 Overactive bladder: Secondary | ICD-10-CM | POA: Diagnosis not present

## 2020-01-20 DIAGNOSIS — N3946 Mixed incontinence: Secondary | ICD-10-CM | POA: Diagnosis not present

## 2020-01-22 DIAGNOSIS — N949 Unspecified condition associated with female genital organs and menstrual cycle: Secondary | ICD-10-CM | POA: Diagnosis not present

## 2020-01-22 DIAGNOSIS — R102 Pelvic and perineal pain: Secondary | ICD-10-CM | POA: Diagnosis not present

## 2020-01-22 DIAGNOSIS — N809 Endometriosis, unspecified: Secondary | ICD-10-CM | POA: Diagnosis not present

## 2020-10-20 DIAGNOSIS — J3089 Other allergic rhinitis: Secondary | ICD-10-CM | POA: Diagnosis not present

## 2020-10-20 DIAGNOSIS — J3081 Allergic rhinitis due to animal (cat) (dog) hair and dander: Secondary | ICD-10-CM | POA: Diagnosis not present

## 2020-10-20 DIAGNOSIS — J301 Allergic rhinitis due to pollen: Secondary | ICD-10-CM | POA: Diagnosis not present

## 2020-10-25 DIAGNOSIS — J301 Allergic rhinitis due to pollen: Secondary | ICD-10-CM | POA: Diagnosis not present

## 2020-10-25 DIAGNOSIS — J3081 Allergic rhinitis due to animal (cat) (dog) hair and dander: Secondary | ICD-10-CM | POA: Diagnosis not present

## 2020-10-25 DIAGNOSIS — J3089 Other allergic rhinitis: Secondary | ICD-10-CM | POA: Diagnosis not present

## 2020-10-31 DIAGNOSIS — J3081 Allergic rhinitis due to animal (cat) (dog) hair and dander: Secondary | ICD-10-CM | POA: Diagnosis not present

## 2020-10-31 DIAGNOSIS — J3089 Other allergic rhinitis: Secondary | ICD-10-CM | POA: Diagnosis not present

## 2020-10-31 DIAGNOSIS — J301 Allergic rhinitis due to pollen: Secondary | ICD-10-CM | POA: Diagnosis not present

## 2020-11-07 DIAGNOSIS — J3089 Other allergic rhinitis: Secondary | ICD-10-CM | POA: Diagnosis not present

## 2020-11-07 DIAGNOSIS — J3081 Allergic rhinitis due to animal (cat) (dog) hair and dander: Secondary | ICD-10-CM | POA: Diagnosis not present

## 2020-11-07 DIAGNOSIS — J301 Allergic rhinitis due to pollen: Secondary | ICD-10-CM | POA: Diagnosis not present

## 2020-11-08 DIAGNOSIS — F411 Generalized anxiety disorder: Secondary | ICD-10-CM | POA: Diagnosis not present

## 2020-11-14 DIAGNOSIS — J3081 Allergic rhinitis due to animal (cat) (dog) hair and dander: Secondary | ICD-10-CM | POA: Diagnosis not present

## 2020-11-14 DIAGNOSIS — J301 Allergic rhinitis due to pollen: Secondary | ICD-10-CM | POA: Diagnosis not present

## 2020-11-14 DIAGNOSIS — J3089 Other allergic rhinitis: Secondary | ICD-10-CM | POA: Diagnosis not present

## 2020-11-14 DIAGNOSIS — F411 Generalized anxiety disorder: Secondary | ICD-10-CM | POA: Diagnosis not present

## 2021-04-10 DIAGNOSIS — L309 Dermatitis, unspecified: Secondary | ICD-10-CM | POA: Diagnosis not present

## 2021-04-10 DIAGNOSIS — Z23 Encounter for immunization: Secondary | ICD-10-CM | POA: Diagnosis not present

## 2021-05-16 DIAGNOSIS — Z124 Encounter for screening for malignant neoplasm of cervix: Secondary | ICD-10-CM | POA: Diagnosis not present

## 2021-05-16 DIAGNOSIS — Z113 Encounter for screening for infections with a predominantly sexual mode of transmission: Secondary | ICD-10-CM | POA: Diagnosis not present

## 2021-05-16 DIAGNOSIS — Z01419 Encounter for gynecological examination (general) (routine) without abnormal findings: Secondary | ICD-10-CM | POA: Diagnosis not present

## 2021-05-16 DIAGNOSIS — Z3041 Encounter for surveillance of contraceptive pills: Secondary | ICD-10-CM | POA: Diagnosis not present

## 2021-09-21 DIAGNOSIS — M7989 Other specified soft tissue disorders: Secondary | ICD-10-CM | POA: Diagnosis not present

## 2021-09-25 DIAGNOSIS — I499 Cardiac arrhythmia, unspecified: Secondary | ICD-10-CM | POA: Diagnosis not present

## 2021-09-25 DIAGNOSIS — R002 Palpitations: Secondary | ICD-10-CM | POA: Diagnosis not present

## 2021-09-25 DIAGNOSIS — F419 Anxiety disorder, unspecified: Secondary | ICD-10-CM | POA: Diagnosis not present

## 2021-09-25 DIAGNOSIS — I10 Essential (primary) hypertension: Secondary | ICD-10-CM | POA: Diagnosis not present

## 2021-11-20 DIAGNOSIS — F419 Anxiety disorder, unspecified: Secondary | ICD-10-CM | POA: Insufficient documentation

## 2022-04-09 DIAGNOSIS — F411 Generalized anxiety disorder: Secondary | ICD-10-CM | POA: Diagnosis not present

## 2022-04-24 DIAGNOSIS — F411 Generalized anxiety disorder: Secondary | ICD-10-CM | POA: Diagnosis not present

## 2022-05-17 DIAGNOSIS — F411 Generalized anxiety disorder: Secondary | ICD-10-CM | POA: Diagnosis not present

## 2022-06-05 DIAGNOSIS — F411 Generalized anxiety disorder: Secondary | ICD-10-CM | POA: Diagnosis not present

## 2022-06-21 ENCOUNTER — Encounter (HOSPITAL_COMMUNITY): Payer: Self-pay

## 2022-06-21 ENCOUNTER — Ambulatory Visit (HOSPITAL_COMMUNITY)
Admission: RE | Admit: 2022-06-21 | Discharge: 2022-06-21 | Disposition: A | Discharge status: Home / Self Care | Payer: Federal, State, Local not specified - PPO | Source: Ambulatory Visit

## 2022-06-21 ENCOUNTER — Ambulatory Visit: Payer: Federal, State, Local not specified - PPO

## 2022-06-21 VITALS — BP 119/78 | HR 95 | Temp 98.2°F | Resp 18

## 2022-06-21 DIAGNOSIS — J302 Other seasonal allergic rhinitis: Secondary | ICD-10-CM | POA: Diagnosis not present

## 2022-06-21 DIAGNOSIS — R519 Headache, unspecified: Secondary | ICD-10-CM | POA: Diagnosis not present

## 2022-06-21 DIAGNOSIS — J3489 Other specified disorders of nose and nasal sinuses: Secondary | ICD-10-CM

## 2022-06-21 MED ORDER — LEVOCETIRIZINE DIHYDROCHLORIDE 5 MG PO TABS: 5.0000 mg | ORAL_TABLET | Freq: Every evening | ORAL | 0 refills | Status: DC

## 2022-06-21 MED ORDER — MONTELUKAST SODIUM 10 MG PO TABS: 10.0000 mg | ORAL_TABLET | Freq: Every day | ORAL | 0 refills | Status: DC

## 2022-06-21 MED ORDER — PREDNISONE 20 MG PO TABS: 40.0000 mg | ORAL_TABLET | Freq: Every day | ORAL | 0 refills | Status: DC

## 2022-06-21 NOTE — ED Triage Notes (Signed)
Pt reports that allergies have gotten worse over the past couple weeks. Monday having really bad sinus pain/pressure. Taking Zyrtec. Unable to get in with allergist until June. BC powder didn't help today with pain.

## 2022-06-21 NOTE — Discharge Instructions (Signed)
I believe that your allergies are contributing to your symptoms.  Stop cetirizine and start levocetirizine.  This can make you drowsy so take it at night.  Start montelukast at night.  This can cause some change in your mood including nightmares so if that happens to stop the medication to be seen immediately.  Follow-up with your allergist as scheduled to determine if you should continue this medication.  Take prednisone 40 mg for 5 days.  Take this in the morning.  Do not take NSAIDs with this medication including aspirin, ibuprofen/Advil, naproxen/Aleve.  You can use Tylenol/acetaminophen as needed.  If you have worsening symptoms including fever, chest pain, shortness of breath, cough you should be seen immediately.

## 2022-06-21 NOTE — ED Provider Notes (Signed)
[facility]    CSN: [id number] Arrival date & time: 06/21/22  1812      History   Chief Complaint Chief Complaint  Patient presents with   Facial Pain    HPI Destiny Simpson is a 37 y.o. female.   Patient presents today with a 5-day history of worsening allergy symptoms.  She reports nasal congestion, sinus pressure, headache, sneezing, postnasal drainage.  Denies any significant cough, fever, chest pain, shortness of breath, nausea, vomiting.  She does see an allergist and contact them when her symptoms began to worsen but they are unable to see her until August 08, 2022.  She has been taking cetirizine without improvement of symptoms.  This generally manages her symptoms.  Denies any known sick contacts or exposure to COVID-19.  She reports that symptoms are similar to her previous episodes of allergy but more severe and not responding to her typical medication.  She is unable to tolerate nasal sprays.  She has not been taking any additional medications to manage her symptoms.  She is confident that she is not pregnant.  Denies any recent antibiotics or steroids.  Denies history of diabetes.    Past Medical History:  Diagnosis Date   Abnormal Pap smear    Allergy    Asthma    Ovarian cyst     There are no problems to display for this patient.   Past Surgical History:  Procedure Laterality Date   ABLATION ON ENDOMETRIOSIS N/A 04/09/2013   Procedure: ABLATION ON ENDOMETRIOSIS;  Surgeon: Zelphia Cairo, MD;  Location: [facility];  Service: Gynecology;  Laterality: N/A;   COLPOSCOPY     NO PAST SURGERIES     OVARIAN CYST REMOVAL Bilateral 04/09/2013   Procedure: OVARIAN CYST drainage ;  Surgeon: Zelphia Cairo, MD;  Location: [facility];  Service: Gynecology;  Laterality: Bilateral;    OB History     Gravida  1   Para  1   Term  1   Preterm      AB      Living  1      SAB      IAB      Ectopic      Multiple      Live Births  1            Home  Medications    Prior to Admission medications   Medication Sig Start Date End Date Taking? Authorizing Provider  levocetirizine (XYZAL ALLERGY 24HR) 5 MG tablet Take 1 tablet (5 mg total) by mouth every evening. 06/21/22  Yes Elfie Costanza K, PA-C  montelukast (SINGULAIR) 10 MG tablet Take 1 tablet (10 mg total) by mouth at bedtime. 06/21/22  Yes Brielle Moro K, PA-C  predniSONE (DELTASONE) 20 MG tablet Take 2 tablets (40 mg total) by mouth daily. 06/21/22  Yes Keshawn Sundberg, Noberto Retort, PA-C  Azelaic Acid (FINACEA) 15 % cream Apply 1 application topically every morning. After skin is thoroughly washed and patted dry, gently but thoroughly massage a thin film of azelaic acid cream into the affected area twice daily, in the morning and evening.    [provider]  ibuprofen (CHILDRENS MOTRIN) 100 MG chewable tablet Chew 4 tablets (400 mg total) by mouth every 6 (six) hours as needed for pain. 06/09/12   Loren Racer, MD  JUNEL FE 1.5/30 1.5-30 MG-MCG tablet Take 1 tablet by mouth daily.    [provider]  levonorgestrel (MIRENA) 20 MCG/24HR IUD 1 each by  Intrauterine route once.    [provider]    Family History Family History  Problem Relation Age of Onset   Hypertension Maternal Grandmother    Diabetes Maternal Grandmother    Cancer Maternal Grandmother    Cancer Paternal Grandmother    Hypertension Paternal Grandfather     Social History Social History   Tobacco Use   Smoking status: Never   Smokeless tobacco: Never  Substance Use Topics   Alcohol use: No   Drug use: No     Allergies   Pepto-bismol [bismuth subsalicylate]   Review of Systems Review of Systems  Constitutional:  Positive for activity change. Negative for appetite change, fatigue and fever.  HENT:  Positive for congestion, postnasal drip, sinus pressure, sinus pain, sneezing and sore throat.   Respiratory:  Negative for cough and shortness of breath.   Cardiovascular:  Negative for chest  pain.  Gastrointestinal:  Negative for abdominal pain, diarrhea, nausea and vomiting.  Neurological:  Positive for headaches. Negative for dizziness and light-headedness.     Physical Exam Triage Vital Signs ED Triage Vitals  Enc Vitals Group     BP 06/21/22 1835 119/78     Pulse Rate 06/21/22 1835 95     Resp 06/21/22 1835 18     Temp 06/21/22 1835 98.2 F (36.8 C)     Temp Source 06/21/22 1835 Oral     SpO2 06/21/22 1835 98 %     Weight --      Height --      Head Circumference --      Peak Flow --      Pain Score 06/21/22 1833 10     Pain Loc --      Pain Edu? --      Excl. in GC? --    No data found.  Updated Vital Signs BP 119/78 (BP Location: Left Arm)   Pulse 95   Temp 98.2 F (36.8 C) (Oral)   Resp 18   SpO2 98%   Visual Acuity Right Eye Distance:   Left Eye Distance:   Bilateral Distance:    Right Eye Near:   Left Eye Near:    Bilateral Near:     Physical Exam Vitals reviewed.  Constitutional:      General: She is awake. She is not in acute distress.    Appearance: Normal appearance. She is well-developed. She is not ill-appearing.     Comments: Very pleasant female appears stated age in no acute distress and comfortable in exam room  HENT:     Head: Normocephalic and atraumatic.     Right Ear: Tympanic membrane, ear canal and external ear normal. Tympanic membrane is not erythematous or bulging.     Left Ear: Tympanic membrane, ear canal and external ear normal. Tympanic membrane is not erythematous or bulging.     Nose:     Right Sinus: Maxillary sinus tenderness present. No frontal sinus tenderness.     Left Sinus: Maxillary sinus tenderness and frontal sinus tenderness present.     Mouth/Throat:     Pharynx: Uvula midline. No oropharyngeal exudate or posterior oropharyngeal erythema.  Cardiovascular:     Rate and Rhythm: Normal rate and regular rhythm.     Heart sounds: Normal heart sounds, S1 normal and S2 normal. No murmur  heard. Pulmonary:     Effort: Pulmonary effort is normal.     Breath sounds: Normal breath sounds. No wheezing, rhonchi or rales.  Comments: Clear to auscultation bilaterally Psychiatric:        Behavior: Behavior is cooperative.      UC Treatments / Results  Labs (all labs ordered are listed, but only abnormal results are displayed) Labs Reviewed - No data to display  EKG   Radiology No results found.  Procedures Procedures (including critical care time)  Medications Ordered in UC Medications - No data to display  Initial Impression / Assessment and Plan / UC Course  I have reviewed the triage vital signs and the nursing notes.  Pertinent labs & imaging results that were available during my care of the patient were reviewed by me and considered in my medical decision making (see chart for details).     Patient is well-appearing, afebrile, nontoxic, nontachycardic.  Viral testing was deferred as she has already been symptomatic for more than 5 days and outside the window of effectiveness for antiviral therapies.  No evidence of acute infection on physical exam that warrant initiation of antibiotics.  Suspect seasonal allergies as etiology of symptoms.  We will transition from cetirizine to levocetirizine if this provides better symptom control.  Patient is unable to tolerate nasal saline/sinus rinses.  Will treat with prednisone burst of 40 mg for 5 days.  Discussed that this can have side effects and if she has any significant side effects with the medication she should stop it to be seen immediately.  Discussed that she is not to take NSAIDs with this medication but can use acetaminophen for pain relief.  Will start montelukast nightly.  We did discuss that this can cause an increase in symptoms including anxiety, depression, nightmares and if she develops any of the symptoms she is to be seen immediately.  She has follow-up scheduled with her allergist, strongly encouraged to  keep this appointment.  If she has any worsening or changing symptoms including new development of fever, chest pain, shortness of breath, cough she is to be seen immediately.  Strict return precautions given.  Work excuse note provided.  Final Clinical Impressions(s) / UC Diagnoses   Final diagnoses:  Seasonal allergies  Sinus pressure  Sinus headache     Discharge Instructions      I believe that your allergies are contributing to your symptoms.  Stop cetirizine and start levocetirizine.  This can make you drowsy so take it at night.  Start montelukast at night.  This can cause some change in your mood including nightmares so if that happens to stop the medication to be seen immediately.  Follow-up with your allergist as scheduled to determine if you should continue this medication.  Take prednisone 40 mg for 5 days.  Take this in the morning.  Do not take NSAIDs with this medication including aspirin, ibuprofen/Advil, naproxen/Aleve.  You can use Tylenol/acetaminophen as needed.  If you have worsening symptoms including fever, chest pain, shortness of breath, cough you should be seen immediately.     ED Prescriptions     Medication Sig Dispense Auth. Provider   montelukast (SINGULAIR) 10 MG tablet Take 1 tablet (10 mg total) by mouth at bedtime. 30 tablet Abdirahim Flavell K, PA-C   levocetirizine (XYZAL ALLERGY 24HR) 5 MG tablet Take 1 tablet (5 mg total) by mouth every evening. 30 tablet Akyla Vavrek K, PA-C   predniSONE (DELTASONE) 20 MG tablet Take 2 tablets (40 mg total) by mouth daily. 10 tablet Tattianna Schnarr, Noberto Retort, PA-C      PDMP not reviewed this encounter.   Alanmichael Barmore, Denny Peon  K, PA-C 06/21/22 1907

## 2022-06-28 DIAGNOSIS — F411 Generalized anxiety disorder: Secondary | ICD-10-CM | POA: Diagnosis not present

## 2022-07-06 ENCOUNTER — Other Ambulatory Visit: Payer: Self-pay

## 2022-07-06 ENCOUNTER — Encounter: Payer: Self-pay | Admitting: Allergy

## 2022-07-06 ENCOUNTER — Ambulatory Visit (INDEPENDENT_AMBULATORY_CARE_PROVIDER_SITE_OTHER): Payer: Federal, State, Local not specified - PPO | Admitting: Allergy

## 2022-07-06 VITALS — BP 120/86 | HR 78 | Temp 98.3°F | Resp 18 | Ht 61.42 in | Wt 160.9 lb

## 2022-07-06 DIAGNOSIS — H1013 Acute atopic conjunctivitis, bilateral: Secondary | ICD-10-CM | POA: Diagnosis not present

## 2022-07-06 DIAGNOSIS — J3089 Other allergic rhinitis: Secondary | ICD-10-CM | POA: Diagnosis not present

## 2022-07-06 DIAGNOSIS — L2089 Other atopic dermatitis: Secondary | ICD-10-CM

## 2022-07-06 DIAGNOSIS — J302 Other seasonal allergic rhinitis: Secondary | ICD-10-CM

## 2022-07-06 DIAGNOSIS — K9049 Malabsorption due to intolerance, not elsewhere classified: Secondary | ICD-10-CM

## 2022-07-06 DIAGNOSIS — T781XXA Other adverse food reactions, not elsewhere classified, initial encounter: Secondary | ICD-10-CM

## 2022-07-06 DIAGNOSIS — T781XXD Other adverse food reactions, not elsewhere classified, subsequent encounter: Secondary | ICD-10-CM

## 2022-07-06 MED ORDER — EPINEPHRINE 0.3 MG/0.3ML IJ SOAJ: 0.3000 mg | INTRAMUSCULAR | 2 refills | Status: DC | PRN

## 2022-07-06 MED ORDER — LEVOCETIRIZINE DIHYDROCHLORIDE 5 MG PO TABS: 5.0000 mg | ORAL_TABLET | Freq: Every evening | ORAL | 0 refills | Status: DC

## 2022-07-06 MED ORDER — LEVOCETIRIZINE DIHYDROCHLORIDE 5 MG PO TABS: 5.0000 mg | ORAL_TABLET | Freq: Every evening | ORAL | 5 refills | Status: DC

## 2022-07-06 MED ORDER — MONTELUKAST SODIUM 10 MG PO TABS: 10.0000 mg | ORAL_TABLET | Freq: Every day | ORAL | 5 refills | Status: DC

## 2022-07-06 MED ORDER — OLOPATADINE HCL 0.2 % OP SOLN: 1.0000 [drp] | Freq: Every day | OPHTHALMIC | 5 refills | Status: DC | PRN

## 2022-07-06 NOTE — Patient Instructions (Signed)
-   Testing today showed: grasses, ragweed, weeds, trees, cat, and cockroach - Copy of test results provided.  - Avoidance measures provided. - Continue with: Xyzal (levocetirizine) 5mg  tablet once daily and Singulair (montelukast) 10mg  daily at bedtime. Continue steam treatments to help with nasal symptoms.  - Start taking: Pataday (olopatadine) one drop per eye daily as needed for itchy/watery eyes.  - You can use an extra dose of the antihistamine, if needed, for breakthrough symptoms.  - Consider allergy shots as a means of long-term control. - Allergy shots "re-train" and "reset" the immune system to ignore environmental allergens and decrease the resulting immune response to those allergens (sneezing, itchy watery eyes, runny nose, nasal congestion, etc).    - Allergy shots improve symptoms in 75-85% of patients.  - We can discuss more at the next appointment if the medications are not working for you.  - Bathe and soak for 5-10 minutes in warm water once a day. Pat dry.  Immediately apply the below cream prescribed to flared areas (red, irritated, dry, itchy, patchy, scaly, flaky) only. Wait several minutes and then apply your moisturizer all over.    To affected areas on the body (below the face and neck), apply: Triamcinolone 0.1 % ointment twice a day as needed. With ointments be careful to avoid the armpits and groin area. - Make a note of any foods that make eczema worse. - Will place dermatology referral for Dr Onalee Hua with [facility]  - Food skin testing is positive to shrimp and shellfish mix.  Negative to milk and fish.    Milk is likely more of an intolerance.  - Continue avoidance of shellfish. - Recommend you have access to self-injectable epinephrine (Epipen or AuviQ) 0.3mg  in case of allergic reaction - Follow emergency action plan in case of allergic reaction   Allergy: food allergy is when you have eaten a food, developed an allergic reaction after eating the food and have IgE  to the food (positive food testing either by skin testing or blood testing).  Food allergy could lead to life threatening symptoms  Sensitivity: occurs when you have IgE to a food (positive food testing either by skin testing or blood testing) but is a food you eat without any issues.  This is not an allergy and we recommend keeping the food in the diet  Intolerance: this is when you have negative testing by either skin testing or blood testing thus not allergic but the food causes symptoms (like belly pain, bloating, diarrhea etc) with ingestion.  These foods should be avoided to prevent symptoms.     Follow-up in 4-6 months or sooner if needed

## 2022-07-06 NOTE — Progress Notes (Signed)
New Patient Note  RE: Destiny Simpson MRN: [id number] DOB: [birth year] Date of Office Visit: 07/06/2022  Primary care provider: Ronnald Ramp, MD  Chief Complaint: allergies  History of present illness: Destiny Simpson is a 37 y.o. female presenting today for evaluation of allergic rhinitis.    She reports her allergy symptoms are sneezing, itchy throat, throat clearing.  Eyes can get itchy when [place] her home [place].  She was taking zyrtec and this was working well up until recently.  Has not needed to use eye drops and nose sprays.  She tried use of nose spryas but felt like she was drowning.  She does do steam treatments.  She went to the ED on 06/21/22 for worsening allergy symptoms with congestion, sinus pressure, facial pain, headache, sneezing, PND. She had maxillary sinus tenderness on exam.  Her zyrtec was changed to xyzal.  She was given a 5 day prednisone burst.  Also started singulair nightly as well.   She has gone to allergist in 2022 at [facility].  She did have environmental allergy testing then and recalls being positive to grass, cat, dog, dust mites.  She states she did start allergy shots for cat and dog for about 7-8 months.  She lost her job at that time and the commute was too far thus she stopped injections.   She has history of eczema.  She wants to see a new dermatologist; her previous dermatologist became to far to travel too.  She states it usually flares in the elbow and knee crease areas.  She will use triamcinolone as needed. Moisturizes with grapeseed oil.  Her previous dermatologist told her to change from dove soap to cetaphil.    Dairy causes her eczema and endometriosis to flare.  She does like milk but has limited ingestion due to this.  She states she had allergy testing when she was 80-9 yo and recalls was positive to fish and shellfish.  She states she can eat fish/shellfish "once in a blue moon" in diet.    She has  history of asthma only with activity when she was running track and she does not do that any more.    Review of systems: Review of Systems  Constitutional: Negative.   HENT:         See HPI  Eyes: Negative.   Respiratory: Negative.    Cardiovascular: Negative.   Gastrointestinal: Negative.   Musculoskeletal: Negative.   Skin: Negative.   Allergic/Immunologic: Negative.   Neurological: Negative.     All other systems negative unless noted above in HPI  Past medical history: Past Medical History:  Diagnosis Date   Abnormal Pap smear    Allergy    Angio-edema    Eczema    Ovarian cyst    Urticaria     Past surgical history: Past Surgical History:  Procedure Laterality Date   ABLATION ON ENDOMETRIOSIS N/A 04/09/2013   Procedure: ABLATION ON ENDOMETRIOSIS;  Surgeon: Zelphia Cairo, MD;  Location: [facility];  Service: Gynecology;  Laterality: N/A;   COLPOSCOPY     NO PAST SURGERIES     OVARIAN CYST REMOVAL Bilateral 04/09/2013   Procedure: OVARIAN CYST drainage ;  Surgeon: Zelphia Cairo, MD;  Location: [facility];  Service: Gynecology;  Laterality: Bilateral;    Family history:  Family History  Problem Relation Age of Onset   Hypertension Maternal Grandmother    Diabetes Maternal Grandmother    Cancer Maternal Grandmother  Cancer Paternal Grandmother    Hypertension Paternal Grandfather     Social history: Lives in a townhome without carpeting with gas and electric heating with central cooling.  No pets in the home.  No concern for water damage, mildew or roaches in the home.  She is a Occupational hygienist.  Denies smoking history.    Medication List: Current Outpatient Medications  Medication Sig Dispense Refill   ibuprofen (CHILDRENS MOTRIN) 100 MG chewable tablet Chew 4 tablets (400 mg total) by mouth every 6 (six) hours as needed for pain. 40 tablet 0   JUNEL FE 1.5/30 1.5-30 MG-MCG tablet Take 1 tablet by mouth daily.     levocetirizine (XYZAL ALLERGY 24HR) 5  MG tablet Take 1 tablet (5 mg total) by mouth every evening. 30 tablet 0   levonorgestrel (MIRENA) 20 MCG/24HR IUD 1 each by Intrauterine route once.     montelukast (SINGULAIR) 10 MG tablet Take 1 tablet (10 mg total) by mouth at bedtime. 30 tablet 0   predniSONE (DELTASONE) 20 MG tablet Take 2 tablets (40 mg total) by mouth daily. 10 tablet 0   Azelaic Acid (FINACEA) 15 % cream Apply 1 application topically every morning. After skin is thoroughly washed and patted dry, gently but thoroughly massage a thin film of azelaic acid cream into the affected area twice daily, in the morning and evening. (Patient not taking: Reported on 07/06/2022)     No current facility-administered medications for this visit.    Known medication allergies: Allergies  Allergen Reactions   Pepto-Bismol [Bismuth Subsalicylate] Hives     Physical examination: Blood pressure 120/86, pulse 78, temperature 98.3 F (36.8 C), resp. rate 18, height 5' 1.42" (1.56 m), weight 160 lb 14.4 oz (73 kg), SpO2 98 %.  General: Alert, interactive, in no acute distress. HEENT: PERRLA, TMs pearly gray, turbinates moderately edematous without discharge, post-pharynx non erythematous. Neck: Supple without lymphadenopathy. Lungs: Clear to auscultation without wheezing, rhonchi or rales. {no increased work of breathing. CV: Normal S1, S2 without murmurs. Abdomen: Nondistended, nontender. Skin: Warm and dry, without lesions or rashes. Extremities:  No clubbing, cyanosis or edema. Neuro:   Grossly intact.  Diagnositics/Labs:  Allergy testing:   Airborne Adult Perc - 07/06/22 0914     Time Antigen Placed 1610    Allergen Manufacturer Waynette Buttery    Location Back    Number of Test 55   1. Control-Buffer 50% Glycerol Negative    2. Control-Histamine Negative    3. Bahia 4+    4. [place] 2+    5. Johnson Negative    6. [place] Blue 4+    7. Meadow Fescue 3+    8. Perennial Rye Negative    9. Timothy 3+    10. Ragweed Mix 2+     11. Cocklebur 2+    12. Plantain,  English 2+    13. Baccharis Negative    14. Dog Fennel Negative    15. Russian Thistle Negative    16. Lamb's Quarters 2+    17. Sheep Sorrell Negative    18. Rough Pigweed 2+    19. Marsh Elder, Rough Negative    20. Mugwort, Common Negative    21. Box, Elder Negative    22. Cedar, red Negative    23. Sweet Gum 2+    24. Pecan Pollen Negative    25. Pine Mix Negative    26. Walnut, Black Pollen 2+    27. Red Mulberry 2+    28. Fara Boros  Mix 2+    29. Birch Mix Negative    30. Beech American 2+    31. Cottonwood, Guinea[place]iss[place] 2+    32. Hickory, White Negative    33. Maple Mix Negative    34. Oak, [place] Mix Negative    35. Sycamore Eastern 2+    36. Alternaria Alternata Negative    37. Cladosporium Herbarum Negative    38. Aspergillus Mix Negative    39. Penicillium Mix Negative    40. Bipolaris Sorokiniana (Helminthosporium) Negative    41. Drechslera Spicifera (Curvularia) Negative    42. Mucor Plumbeus Negative    43. Fusarium Moniliforme Negative    44. Aureobasidium Pullulans (pullulara) Negative    45. Rhizopus Oryzae Negative    46. Botrytis Cinera Negative    47. Epicoccum Nigrum Negative    48. Phoma Betae Negative    49. Dust Mite Mix Negative    50. Cat Hair 10,000 BAU/ml 4+    51.  Dog Epithelia Negative    52. Mixed Feathers Negative    53. Horse Epithelia Negative    54. Cockroach, German 2+    55. Tobacco Leaf Negative             Food Adult Perc - 07/06/22 0900     Time Antigen Placed 1610    Allergen Manufacturer Waynette Buttery    Location Back    Number of allergen test 13    5. Milk, Cow Negative    8. Shellfish Mix 2+    9. Fish Mix Negative    18. Trout Negative    19. Tuna Negative    20. Salmon Negative    21. Flounder Negative    22. Codfish Negative    23. Shrimp 2+    24. Crab Negative    25. Lobster Negative    26. Oyster Negative    27. Scallops Negative             Allergy testing results were  read and interpreted by provider, documented by clinical staff.   Assessment and plan: Allergic rhinitis with conjunctivitis - Testing today showed: grasses, ragweed, weeds, trees, cat, and cockroach - Copy of test results provided.  - Avoidance measures provided. - Continue with: Xyzal (levocetirizine) 5mg  tablet once daily and Singulair (montelukast) 10mg  daily at bedtime. Continue steam treatments to help with nasal symptoms.  - Start taking: Pataday (olopatadine) one drop per eye daily as needed for itchy/watery eyes.  - You can use an extra dose of the antihistamine, if needed, for breakthrough symptoms.  - Consider allergy shots as a means of long-term control. - Allergy shots "re-train" and "reset" the immune system to ignore environmental allergens and decrease the resulting immune response to those allergens (sneezing, itchy watery eyes, runny nose, nasal congestion, etc).    - Allergy shots improve symptoms in 75-85% of patients.  - We can discuss more at the next appointment if the medications are not working for you.  Atopic dermatitis - Bathe and soak for 5-10 minutes in warm water once a day. Pat dry.  Immediately apply the below cream prescribed to flared areas (red, irritated, dry, itchy, patchy, scaly, flaky) only. Wait several minutes and then apply your moisturizer all over.    To affected areas on the body (below the face and neck), apply: Triamcinolone 0.1 % ointment twice a day as needed. With ointments be careful to avoid the armpits and groin area. - Make a note of any foods that make  eczema worse. - Will place dermatology referral for Dr Onalee Hua with [facility]  Food intolerance Adverse food reaction - Food skin testing is positive to shrimp and shellfish mix.  Negative to milk and fish.    Milk is likely more of an intolerance.  - Continue avoidance of shellfish. - Recommend you have access to self-injectable epinephrine (Epipen or AuviQ) 0.3mg  in case of allergic  reaction - Follow emergency action plan in case of allergic reaction   Allergy: food allergy is when you have eaten a food, developed an allergic reaction after eating the food and have IgE to the food (positive food testing either by skin testing or blood testing).  Food allergy could lead to life threatening symptoms  Sensitivity: occurs when you have IgE to a food (positive food testing either by skin testing or blood testing) but is a food you eat without any issues.  This is not an allergy and we recommend keeping the food in the diet  Intolerance: this is when you have negative testing by either skin testing or blood testing thus not allergic but the food causes symptoms (like belly pain, bloating, diarrhea etc) with ingestion.  These foods should be avoided to prevent symptoms.     Follow-up in 4-6 months or sooner if needed  I appreciate the opportunity to take part in Destiny Simpson's care. Please do not hesitate to contact me with questions.  Sincerely,   Margo Aye, MD Allergy/Immunology [facility]

## 2022-07-10 ENCOUNTER — Telehealth: Payer: Self-pay

## 2022-07-10 ENCOUNTER — Other Ambulatory Visit (HOSPITAL_COMMUNITY): Payer: Self-pay

## 2022-07-10 ENCOUNTER — Encounter: Payer: Self-pay | Admitting: Allergy

## 2022-07-10 MED ORDER — EPINEPHRINE 0.3 MG/0.3ML IJ SOAJ: 0.3000 mg | INTRAMUSCULAR | 2 refills | Status: AC | PRN

## 2022-07-10 NOTE — Telephone Encounter (Signed)
I called the patient and left a message for her to call the [place] office back.

## 2022-07-10 NOTE — Addendum Note (Signed)
Addended by: Dub Mikes on: 07/10/2022 05:11 PM   Modules accepted: Orders

## 2022-07-10 NOTE — Telephone Encounter (Signed)
PA request received via CMM for Levocetirizine Dihydrochloride 5MG  tablets  PA not able to be submitted via CMM  *Key: [id number]  [employer] insurance called [phone] and initiated PA over the phone.   Informed that Levocetirizine is not a covered medication and PA cannot be processed   Montelukast, Zafrilukast, Accolate, Singulair are the preferred alternatives at this time per representative.  *Patient can also get Levocetirizine OTC

## 2022-07-10 NOTE — Telephone Encounter (Addendum)
She need to have rx sent to [facility] in [place], on [address] [phone]  Auvi-Q

## 2022-07-19 ENCOUNTER — Telehealth: Payer: Self-pay | Admitting: Allergy

## 2022-07-19 NOTE — Telephone Encounter (Signed)
Referral placed to Dr. Onalee Hua.   [facility] [address] [place], [place] [address] [phone]  Sent patient Destiny Simpson message with information.

## 2022-07-19 NOTE — Telephone Encounter (Signed)
-----   Message from [facility] Larose Hires, MD sent at 07/06/2022  1:34 PM EDT ----- Pt would like a derm referral to Dr Onalee Hua for atopic dermatitis.  Her previous dermatologist is too far.

## 2022-07-23 DIAGNOSIS — F411 Generalized anxiety disorder: Secondary | ICD-10-CM | POA: Diagnosis not present

## 2022-07-30 ENCOUNTER — Encounter: Payer: Self-pay | Admitting: Family Medicine

## 2022-07-30 ENCOUNTER — Ambulatory Visit: Payer: Federal, State, Local not specified - PPO | Admitting: Family Medicine

## 2022-07-30 VITALS — BP 131/86 | HR 74 | Ht 61.0 in | Wt 167.5 lb

## 2022-07-30 DIAGNOSIS — R5383 Other fatigue: Secondary | ICD-10-CM

## 2022-07-30 DIAGNOSIS — L309 Dermatitis, unspecified: Secondary | ICD-10-CM | POA: Insufficient documentation

## 2022-07-30 DIAGNOSIS — N809 Endometriosis, unspecified: Secondary | ICD-10-CM

## 2022-07-30 DIAGNOSIS — E669 Obesity, unspecified: Secondary | ICD-10-CM | POA: Diagnosis not present

## 2022-07-30 DIAGNOSIS — Z8639 Personal history of other endocrine, nutritional and metabolic disease: Secondary | ICD-10-CM | POA: Diagnosis not present

## 2022-07-30 DIAGNOSIS — L308 Other specified dermatitis: Secondary | ICD-10-CM | POA: Diagnosis not present

## 2022-07-30 DIAGNOSIS — Z7689 Persons encountering health services in other specified circumstances: Secondary | ICD-10-CM

## 2022-07-30 DIAGNOSIS — R030 Elevated blood-pressure reading, without diagnosis of hypertension: Secondary | ICD-10-CM | POA: Diagnosis not present

## 2022-07-30 DIAGNOSIS — T7840XD Allergy, unspecified, subsequent encounter: Secondary | ICD-10-CM

## 2022-07-30 DIAGNOSIS — T7840XA Allergy, unspecified, initial encounter: Secondary | ICD-10-CM | POA: Insufficient documentation

## 2022-07-30 NOTE — Assessment & Plan Note (Signed)
Chronic  Patient encouraged to continue attending the gym and managing diet  CMP, A1c, Tsh and free T4 collected today along with CBC

## 2022-07-30 NOTE — Assessment & Plan Note (Signed)
Chronic  Multiple allergy triggers identified on testing  She is established patient with allergy and immunology, recommended follow up as scheduled  Reports improved symptoms  Continue Xyzal 5mg  nightly, continue singulair 10mg  at bedtime  Continue pataday eye drops, continue epipen PRN to prevent anaphylaxis

## 2022-07-30 NOTE — Assessment & Plan Note (Signed)
Chronic  Symptoms well controlled for now  Has referral for dermatology  Recommended scheduling visit to establish care, patient plans to reach out for appointment scheduling today

## 2022-07-30 NOTE — Patient Instructions (Signed)
It was a pleasure meeting you today!  Welcome to [facility].  I look forward to taking part in your care as your new primary care physician.    Summary of our discussion today:   We will follow up with results of labs once they are available.   Please remember to schedule your annual physical one year from your last physical.   You should return to our clinic in 2 months for physical.   Best Wishes,   Dr. Roxan Hockey

## 2022-07-30 NOTE — Assessment & Plan Note (Signed)
Welcomed patient to [facility]  Reviewed patient's medical history, medications, surgical and social history Discussed roles and expectations for primary care physician-patient relationship Recommended patient schedule annual preventative examinations   

## 2022-07-30 NOTE — Assessment & Plan Note (Signed)
Chronic  Reported hx  Will obtain vitamin D levels as patient reports symptoms of fatigue  Vitamin D ordered  She will continue dietary supplements

## 2022-07-30 NOTE — Assessment & Plan Note (Signed)
Chronic  Patient reports prior elevated BP measurements  Not current on antihypertensive agents  Recommended CMP, TSH today to establish baseline  Will also measure A1c to evaluate for metabolic syndrome given BMI 31.65

## 2022-07-30 NOTE — Progress Notes (Signed)
I,Destiny Simpson,acting as a Neurosurgeon for [employer], MD.,have documented all relevant documentation on the behalf of Ronnald Ramp, MD,as directed by  Ronnald Ramp, MD while in the presence of Ronnald Ramp, MD.   New patient visit   Patient: Destiny Simpson   DOB: 1985-11-21   37 y.o. Female  MRN: [id number] Visit Date: 07/30/2022  Today's healthcare provider: Ronnald Ramp, MD   Chief Complaint  Patient presents with   Establish Care   Subjective    Destiny Simpson is a 37 y.o. female who presents today as a new patient to establish care.  HPI   Encounter to Establish Care Patient presents to establish care  Introduced myself and my role as primary care physician  We reviewed patient's medical, surgical, and social history and medications as listed below    PMHX   Last annual physical: 05/2021   Eczema  Urticaria  Recently referred to dermatology  Will reach out to specialist  Reports symptoms are mostly bothersome    Allergic Rhinitis   Medications: Xyzal 5mg , Singulair 10mg  qHS, Olopatadine eye drops  Followed by allergy and immunology, Dr. Delorse Lek  Considering eye drops    Anxiety  Sees a therapist every 2 weeks  Still reports some brain fog, reports still grieving the loss of her grandmother  Recommended to start Zoloft 50mg  but did not feel well on the medication so stopped after three days  She has been managing with walking, stretching and reading    Reports occasional foot swelling, night sweats and insomnia - reports traveling and eating out more so  Reports BP of 135/100 Reports that since she started the Singulair she has been able to sleep much better,averaging 8 hours per night, reports that she used to sleep only 5 hours when she first lost her grandmother  Reports that she also gets naps throughout the day while traveling for work  She has tried magnesium powder in drinks and lotion     Social Hx  Tobacco use: never smoker  Alcohol Use : occasionally  Illicit drug use: denies    Fam Hx  Reports family hx of ovarian cancer in her grandmother  She would prefer to have pap smears every 3 years rather than every 5 years  Reports that her maternal grandmother had 6 bouts of cancer (breast cancer (bilateral), lung cancer and brain cancer) - passed at 57    Concerns for Today:   Endometriosis Treatment: currently on OCP, would like to establish with new OB/Gyn who is closer, she has not had a cycle in several decades   Past Medical History:  Diagnosis Date   Abnormal Pap smear    Allergy    Angio-edema    Anxiety    Asthma    Eczema    Hypertension 09/21/2021   Ovarian cyst    Urticaria    Past Surgical History:  Procedure Laterality Date   ABLATION ON ENDOMETRIOSIS N/A 04/09/2013   Procedure: ABLATION ON ENDOMETRIOSIS;  Surgeon: Zelphia Cairo, MD;  Location: [facility];  Service: Gynecology;  Laterality: N/A;   CESAREAN SECTION  04/08/2013   Laparoscopic   COLPOSCOPY     NO PAST SURGERIES     OVARIAN CYST REMOVAL Bilateral 04/09/2013   Procedure: OVARIAN CYST drainage ;  Surgeon: Zelphia Cairo, MD;  Location: [facility];  Service: Gynecology;  Laterality: Bilateral;   Family Status  Relation Name Status   Mother Angelita Ingles Alive   Father  Alive  MGM Boris Lown Alive   PGM Para March Alive   PGF  (Not Specified)   MGF Crissie Reese (Not Specified)   Mat Aunt Patsy Baltimore (Not Specified)   Mat Aunt Particia Lather (Not Specified)   Family History  Problem Relation Age of Onset   Obesity Mother    Hypertension Maternal Grandmother    Diabetes Maternal Grandmother    Cancer Maternal Grandmother    Miscarriages / Stillbirths Maternal Grandmother    Stroke Maternal Grandmother    Cancer Paternal Grandmother    Asthma Paternal Grandmother    COPD Paternal Grandmother    Diabetes Paternal Grandmother    Stroke Paternal Grandmother    Hypertension  Paternal Grandfather    Hearing loss Maternal Grandfather    Diabetes Maternal Aunt    Miscarriages / Stillbirths Maternal Aunt    Early death Maternal Aunt    Social History   Socioeconomic History   Marital status: Single    Spouse name: Not on file   Number of children: Not on file   Years of education: Not on file   Highest education level: Bachelor's degree (e.g., BA, AB, BS)  Occupational History   Not on file  Tobacco Use   Smoking status: Never   Smokeless tobacco: Never  Substance and Sexual Activity   Alcohol use: Yes    Alcohol/week: 1.0 standard drink of alcohol    Types: 1 Glasses of wine per week   Drug use: No   Sexual activity: Yes    Birth control/protection: Pill  Other Topics Concern   Not on file  Social History Narrative   Not on file   Social Determinants of Health   Financial Resource Strain: Low Risk  (07/27/2022)   Overall Financial Resource Strain (CARDIA)    Difficulty of Paying Living Expenses: Not hard at all  Food Insecurity: No Food Insecurity (07/27/2022)   Hunger Vital Sign    Worried About Running Out of Food in the Last Year: Never true    Ran Out of Food in the Last Year: Never true  Transportation Needs: No Transportation Needs (07/27/2022)   PRAPARE - Administrator, Civil Service (Medical): No    Lack of Transportation (Non-Medical): No  Physical Activity: Insufficiently Active (07/27/2022)   Exercise Vital Sign    Days of Exercise per Week: 3 days    Minutes of Exercise per Session: 30 min  Stress: Stress Concern Present (07/27/2022)   [employer] of Occupational Health - Occupational Stress Questionnaire    Feeling of Stress : Rather much  Social Connections: Socially Integrated (07/27/2022)   Social Connection and Isolation Panel [NHANES]    Frequency of Communication with Friends and Family: More than three times a week    Frequency of Social Gatherings with Friends and Family: More than three times a week     Attends Religious Services: More than 4 times per year    Active Member of Clubs or Organizations: Yes    Attends Banker Meetings: 1 to 4 times per year    Marital Status: Living with partner   Outpatient Medications Prior to Visit  Medication Sig   Azelaic Acid (FINACEA) 15 % cream Apply 1 application  topically every morning. After skin is thoroughly washed and patted dry, gently but thoroughly massage a thin film of azelaic acid cream into the affected area twice daily, in the morning and evening.   EPINEPHrine (AUVI-Q) 0.3 mg/0.3 mL IJ SOAJ injection Inject  0.3 mg into the muscle as needed for anaphylaxis.   ibuprofen (CHILDRENS MOTRIN) 100 MG chewable tablet Chew 4 tablets (400 mg total) by mouth every 6 (six) hours as needed for pain.   JUNEL FE 1.5/30 1.5-30 MG-MCG tablet Take 1 tablet by mouth daily.   levocetirizine (XYZAL ALLERGY 24HR) 5 MG tablet Take 1 tablet (5 mg total) by mouth every evening.   levocetirizine (XYZAL) 5 MG tablet Take 1 tablet (5 mg total) by mouth every evening.   montelukast (SINGULAIR) 10 MG tablet Take 1 tablet (10 mg total) by mouth at bedtime.   Olopatadine HCl 0.2 % SOLN Apply 1 drop to eye daily as needed (itchy/watery eyes).   [DISCONTINUED] levonorgestrel (MIRENA) 20 MCG/24HR IUD 1 each by Intrauterine route once.   No facility-administered medications prior to visit.   Allergies  Allergen Reactions   [place] Grass    Cat Hair Extract    Pepto-Bismol [Bismuth Subsalicylate] Hives   Shellfish Allergy    Shrimp (Diagnostic)    Sweet Gum Allergy Skin Test     Immunization History  Administered Date(s) Administered   DTaP 05/18/1988, 06/09/1990   HIB, Unspecified 05/18/1988   Hepatitis B, PED/ADOLESCENT 10/20/1996, 11/24/1996, 05/26/1997   Janssen (J&J) SARS-COV-2 Vaccination 05/19/2019   MMR 06/09/1990   OPV 05/18/1988, 06/09/1990   Td 03/27/2000   Tdap 02/13/2015    Health Maintenance  Topic Date Due   HIV Screening   Never done   Hepatitis C Screening  Never done   PAP SMEAR-Modifier  Never done   COVID-19 Vaccine (2 - 2023-24 season) 10/13/2021   INFLUENZA VACCINE  09/13/2022   DTaP/Tdap/Td (5 - Td or Tdap) 02/12/2025   HPV VACCINES  Aged Out    Patient Care Team: Ronnald Ramp, MD as PCP - General (Family Medicine) Lowella Curb, PA as Physician Assistant (Obstetrics and Gynecology)  Review of Systems  Constitutional:  Positive for fatigue.  Cardiovascular:  Positive for leg swelling.       Objective    BP 131/86 (BP Location: Right Arm, Patient Position: Sitting, Cuff Size: Normal)   Pulse 74   Ht 5\' 1"  (1.549 m)   Wt 167 lb 8 oz (76 kg)   SpO2 100%   BMI 31.65 kg/m    Physical Exam Vitals reviewed.  Constitutional:      General: She is not in acute distress.    Appearance: Normal appearance. She is not ill-appearing, toxic-appearing or diaphoretic.  Eyes:     Conjunctiva/sclera: Conjunctivae normal.  Neck:     Thyroid: No thyroid mass, thyromegaly or thyroid tenderness.  Cardiovascular:     Rate and Rhythm: Normal rate and regular rhythm.     Pulses: Normal pulses.     Heart sounds: Normal heart sounds. No murmur heard.    No friction rub. No gallop.  Pulmonary:     Effort: Pulmonary effort is normal. No respiratory distress.     Breath sounds: Normal breath sounds. No stridor. No wheezing, rhonchi or rales.  Abdominal:     General: Bowel sounds are normal. There is no distension.     Palpations: Abdomen is soft.     Tenderness: There is no abdominal tenderness.  Musculoskeletal:     Comments: Trace LE edema   Skin:    Findings: No erythema or rash.  Neurological:     Mental Status: She is alert and oriented to person, place, and time.      Depression Screen    07/30/2022  9:20 AM  PHQ 2/9 Scores  PHQ - 2 Score 0  PHQ- 9 Score 6   No results found for any visits on 07/30/22.  Assessment & Plan      Problem List Items Addressed This Visit        Musculoskeletal and Integument   Eczema    Chronic  Symptoms well controlled for now  Has referral for dermatology  Recommended scheduling visit to establish care, patient plans to reach out for appointment scheduling today          Other   Establishing care with new doctor, encounter for    Welcomed patient to [facility]  Reviewed patient's medical history, medications, surgical and social history Discussed roles and expectations for primary care physician-patient relationship Recommended patient schedule annual preventative examinations        Allergies    Chronic  Multiple allergy triggers identified on testing  She is established patient with allergy and immunology, recommended follow up as scheduled  Reports improved symptoms  Continue Xyzal 5mg  nightly, continue singulair 10mg  at bedtime  Continue pataday eye drops, continue epipen PRN to prevent anaphylaxis        Endometriosis, unspecified - Primary    Chronic  Patient requesting referral for specialist  Referral submitted for minimally invasive GYN with [facility]  She will continue OCP: Junel FE      Relevant Orders   Ambulatory referral to Gynecology   Elevated blood pressure reading in office without diagnosis of hypertension    Chronic  Patient reports prior elevated BP measurements  Not current on antihypertensive agents  Recommended CMP, TSH today to establish baseline  Will also measure A1c to evaluate for metabolic syndrome given BMI 31.65      Relevant Orders   Comprehensive metabolic panel   CBC   TSH + free T4   History of vitamin D deficiency    Chronic  Reported hx  Will obtain vitamin D levels as patient reports symptoms of fatigue  Vitamin D ordered  She will continue dietary supplements       Relevant Orders   Vitamin D (25 hydroxy)   Obesity (BMI 30-39.9)   Relevant Orders   TSH + free T4   Hemoglobin A1c   Other Visit Diagnoses     Other fatigue        Relevant Orders   B12        Return in about 2 months (around 09/29/2022) for CPE.       The entirety of the information documented in the History of Present Illness, Review of Systems and Physical Exam were personally obtained by me. Portions of this information were initially documented by Destiny Simpson,CMA. I, Ronnald Ramp, MD have reviewed the documentation above for thoroughness and accuracy.      Ronnald Ramp, MD  [facility] [phone] (phone) [phone] (fax)  [facility]

## 2022-07-30 NOTE — Assessment & Plan Note (Signed)
Chronic  Patient requesting referral for specialist  Referral submitted for minimally invasive GYN with [facility]  She will continue OCP: Junel FE

## 2022-07-31 LAB — HEMOGLOBIN A1C
Est. average glucose Bld gHb Est-mCnc: 114 mg/dL
Hgb A1c MFr Bld: 5.6 % (ref 4.8–5.6)

## 2022-07-31 LAB — CBC
Hematocrit: 39.9 % (ref 34.0–46.6)
Hemoglobin: 12.7 g/dL (ref 11.1–15.9)
MCH: 27.1 pg (ref 26.6–33.0)
MCHC: 31.8 g/dL (ref 31.5–35.7)
MCV: 85 fL (ref 79–97)
Platelets: 374 10*3/uL (ref 150–450)
RBC: 4.68 x10E6/uL (ref 3.77–5.28)
RDW: 13.4 % (ref 11.7–15.4)
WBC: 6.7 10*3/uL (ref 3.4–10.8)

## 2022-07-31 LAB — COMPREHENSIVE METABOLIC PANEL
ALT: 17 IU/L (ref 0–32)
AST: 13 IU/L (ref 0–40)
Albumin: 3.9 g/dL (ref 3.9–4.9)
Alkaline Phosphatase: 75 IU/L (ref 44–121)
BUN/Creatinine Ratio: 13 (ref 9–23)
BUN: 11 mg/dL (ref 6–20)
Bilirubin Total: 0.4 mg/dL (ref 0.0–1.2)
CO2: 23 mmol/L (ref 20–29)
Calcium: 8.7 mg/dL (ref 8.7–10.2)
Chloride: 104 mmol/L (ref 96–106)
Creatinine, Ser: 0.82 mg/dL (ref 0.57–1.00)
Globulin, Total: 2.6 g/dL (ref 1.5–4.5)
Glucose: 82 mg/dL (ref 70–99)
Potassium: 4.4 mmol/L (ref 3.5–5.2)
Sodium: 138 mmol/L (ref 134–144)
Total Protein: 6.5 g/dL (ref 6.0–8.5)
eGFR: 94 mL/min/{1.73_m2} (ref >59)

## 2022-07-31 LAB — TSH+FREE T4
Free T4: 0.98 ng/dL (ref 0.82–1.77)
TSH: 1.44 u[IU]/mL (ref 0.450–4.500)

## 2022-07-31 LAB — VITAMIN D 25 HYDROXY (VIT D DEFICIENCY, FRACTURES): Vit D, 25-Hydroxy: 55.9 ng/mL (ref 30.0–100.0)

## 2022-07-31 LAB — VITAMIN B12: Vitamin B-12: 1371 pg/mL — ABNORMAL HIGH (ref 232–1245)

## 2022-08-07 DIAGNOSIS — F411 Generalized anxiety disorder: Secondary | ICD-10-CM | POA: Diagnosis not present

## 2022-08-08 ENCOUNTER — Other Ambulatory Visit: Payer: Self-pay | Admitting: Family Medicine

## 2022-08-08 ENCOUNTER — Ambulatory Visit: Payer: BC Managed Care – PPO | Admitting: Allergy

## 2022-08-08 ENCOUNTER — Ambulatory Visit: Payer: Self-pay | Admitting: Family Medicine

## 2022-08-09 ENCOUNTER — Encounter: Payer: Self-pay | Admitting: Family Medicine

## 2022-08-09 ENCOUNTER — Other Ambulatory Visit: Payer: Self-pay

## 2022-08-09 MED ORDER — JUNEL FE 1.5/30 1.5-30 MG-MCG PO TABS
1.0000 | ORAL_TABLET | Freq: Every day | ORAL | 11 refills | Status: DC
  Filled 2022-08-09 – 2022-08-10 (×2): qty 28, 28d supply, fill #0
  Filled 2022-08-25: qty 28, 28d supply, fill #1

## 2022-08-10 ENCOUNTER — Other Ambulatory Visit: Payer: Self-pay

## 2022-08-25 ENCOUNTER — Other Ambulatory Visit: Payer: Self-pay

## 2022-08-27 ENCOUNTER — Other Ambulatory Visit: Payer: Self-pay

## 2022-08-28 ENCOUNTER — Other Ambulatory Visit: Payer: Self-pay

## 2022-09-03 ENCOUNTER — Encounter: Payer: Self-pay | Admitting: Family Medicine

## 2022-09-03 DIAGNOSIS — F411 Generalized anxiety disorder: Secondary | ICD-10-CM | POA: Diagnosis not present

## 2022-09-17 DIAGNOSIS — F411 Generalized anxiety disorder: Secondary | ICD-10-CM | POA: Diagnosis not present

## 2022-09-28 DIAGNOSIS — Z0000 Encounter for general adult medical examination without abnormal findings: Secondary | ICD-10-CM | POA: Insufficient documentation

## 2022-09-28 NOTE — Progress Notes (Unsigned)
Complete physical exam   Patient: Destiny Simpson   DOB: 11-Jan-1986   37 y.o. Female  MRN: [id number] Visit Date: 10/01/2022  Today's healthcare provider: Ronnald Ramp, MD   No chief complaint on file.  Subjective    Destiny Simpson is a 37 y.o. female who presents today for a complete physical exam.   She reports consuming a {diet types:17450} diet.   {Exercise:19826} She generally feels {well/fairly well/poorly:18703}.   She reports sleeping {well/fairly well/poorly:18703}.    She {does/does not:200015} have additional problems to discuss today.   Influenza vaccine and COVID vaccine recommended   Past Medical History:  Diagnosis Date   Abnormal Pap smear    Allergy    Angio-edema    Anxiety    Asthma    Eczema    Hypertension 09/21/2021   Ovarian cyst    Urticaria    Past Surgical History:  Procedure Laterality Date   ABLATION ON ENDOMETRIOSIS N/A 04/09/2013   Procedure: ABLATION ON ENDOMETRIOSIS;  Surgeon: Zelphia Cairo, MD;  Location: [facility];  Service: Gynecology;  Laterality: N/A;   CESAREAN SECTION  04/08/2013   Laparoscopic   COLPOSCOPY     NO PAST SURGERIES     OVARIAN CYST REMOVAL Bilateral 04/09/2013   Procedure: OVARIAN CYST drainage ;  Surgeon: Zelphia Cairo, MD;  Location: [facility];  Service: Gynecology;  Laterality: Bilateral;   Social History   Socioeconomic History   Marital status: Single    Spouse name: Not on file   Number of children: Not on file   Years of education: Not on file   Highest education level: Bachelor's degree (e.g., BA, AB, BS)  Occupational History   Not on file  Tobacco Use   Smoking status: Never   Smokeless tobacco: Never  Substance and Sexual Activity   Alcohol use: Yes    Alcohol/week: 1.0 standard drink of alcohol    Types: 1 Glasses of wine per week   Drug use: No   Sexual activity: Yes    Birth control/protection: Pill  Other Topics Concern   Not on file  Social History Narrative    Not on file   Social Determinants of Health   Financial Resource Strain: Low Risk  (07/27/2022)   Overall Financial Resource Strain (CARDIA)    Difficulty of Paying Living Expenses: Not hard at all  Food Insecurity: No Food Insecurity (07/27/2022)   Hunger Vital Sign    Worried About Running Out of Food in the Last Year: Never true    Ran Out of Food in the Last Year: Never true  Transportation Needs: No Transportation Needs (07/27/2022)   PRAPARE - Administrator, Civil Service (Medical): No    Lack of Transportation (Non-Medical): No  Physical Activity: Insufficiently Active (07/27/2022)   Exercise Vital Sign    Days of Exercise per Week: 3 days    Minutes of Exercise per Session: 30 min  Stress: Stress Concern Present (07/27/2022)   [employer] of Occupational Health - Occupational Stress Questionnaire    Feeling of Stress : Rather much  Social Connections: Socially Integrated (07/27/2022)   Social Connection and Isolation Panel [NHANES]    Frequency of Communication with Friends and Family: More than three times a week    Frequency of Social Gatherings with Friends and Family: More than three times a week    Attends Religious Services: More than 4 times per year    Active Member of [employer] or Organizations:  Yes    Attends Club or Organization Meetings: 1 to 4 times per year    Marital Status: Living with partner  Intimate Partner Violence: Unknown (05/15/2021)   [facility]   HITS    Physically Hurt: Not on file    Insult or Talk Down To: Not on file    Threaten Physical Harm: Not on file    Scream or Curse: Not on file   Family Status  Relation Name Status   Mother Angelita Ingles Alive   Father  Alive   MGM Boris Lown Alive   PGM Para March Alive   PGF  (Not Specified)   MGF Crissie Reese (Not Specified)   Mat Aunt Patsy [place] (Not Specified)   Mat Aunt Particia Lather (Not Specified)  No partnership data on file   Family  History  Problem Relation Age of Onset   Obesity Mother    Hypertension Maternal Grandmother    Diabetes Maternal Grandmother    Cancer Maternal Grandmother    Miscarriages / Stillbirths Maternal Grandmother    Stroke Maternal Grandmother    Cancer Paternal Grandmother    Asthma Paternal Grandmother    COPD Paternal Grandmother    Diabetes Paternal Grandmother    Stroke Paternal Grandmother    Hypertension Paternal Grandfather    Hearing loss Maternal Grandfather    Diabetes Maternal Aunt    Miscarriages / Stillbirths Maternal Aunt    Early death Maternal Aunt    Allergies  Allergen Reactions   [place] Grass    Cat Hair Extract    Pepto-Bismol [Bismuth Subsalicylate] Hives   Shellfish Allergy    Shrimp (Diagnostic)    Sweet Gum Allergy Skin Test      Medications: Outpatient Medications Prior to Visit  Medication Sig   Azelaic Acid (FINACEA) 15 % cream Apply 1 application  topically every morning. After skin is thoroughly washed and patted dry, gently but thoroughly massage a thin film of azelaic acid cream into the affected area twice daily, in the morning and evening.   EPINEPHrine (AUVI-Q) 0.3 mg/0.3 mL IJ SOAJ injection Inject 0.3 mg into the muscle as needed for anaphylaxis.   ibuprofen (CHILDRENS MOTRIN) 100 MG chewable tablet Chew 4 tablets (400 mg total) by mouth every 6 (six) hours as needed for pain.   JUNEL FE 1.5/30 1.5-30 MG-MCG tablet Take 1 tablet by mouth daily.   levocetirizine (XYZAL ALLERGY 24HR) 5 MG tablet Take 1 tablet (5 mg total) by mouth every evening.   levocetirizine (XYZAL) 5 MG tablet Take 1 tablet (5 mg total) by mouth every evening.   montelukast (SINGULAIR) 10 MG tablet Take 1 tablet (10 mg total) by mouth at bedtime.   Olopatadine HCl 0.2 % SOLN Apply 1 drop to eye daily as needed (itchy/watery eyes).   No facility-administered medications prior to visit.    Review of Systems  {Insert previous labs (optional):23779} {See past labs  Heme   Chem  Endocrine  Serology  Results Review (optional):1}  Objective    There were no vitals taken for this visit. {Insert last BP/Wt (optional):23777}{See vitals history (optional):1}    Physical Exam  ***  Last depression screening scores    07/30/2022    9:20 AM  PHQ 2/9 Scores  PHQ - 2 Score 0  PHQ- 9 Score 6    Last fall risk screening    07/30/2022    9:19 AM  Fall Risk   Falls in the past year?  0  Number falls in past yr: 0  Injury with Fall? 0  Risk for fall due to : No Fall Risks  Follow up Falls evaluation completed    Last Audit-C alcohol use screening    07/27/2022    8:58 AM  Alcohol Use Disorder Test (AUDIT)  1. How often do you have a drink containing alcohol? 1  2. How many drinks containing alcohol do you have on a typical day when you are drinking? 0  3. How often do you have six or more drinks on one occasion? 1  AUDIT-C Score 2   A score of 3 or more in women, and 4 or more in men indicates increased risk for alcohol abuse, EXCEPT if all of the points are from question 1   No results found for any visits on 10/01/22.  Assessment & Plan    Routine Health Maintenance and Physical Exam  Immunization History  Administered Date(s) Administered   DTaP 05/18/1988, 06/09/1990   HIB, Unspecified 05/18/1988   Hepatitis B, PED/ADOLESCENT 10/20/1996, 11/24/1996, 05/26/1997   Janssen (J&J) SARS-COV-2 Vaccination 05/19/2019   MMR 06/09/1990   OPV 05/18/1988, 06/09/1990   Td 03/27/2000   Tdap 02/13/2015    Health Maintenance  Topic Date Due   HIV Screening  Never done   Hepatitis C Screening  Never done   PAP SMEAR-Modifier  Never done   COVID-19 Vaccine (2 - 2023-24 season) 10/13/2021   INFLUENZA VACCINE  09/13/2022   DTaP/Tdap/Td (5 - Td or Tdap) 02/12/2025   HPV VACCINES  Aged Out    Problem List Items Addressed This Visit   None    No follow-ups on file.       Ronnald Ramp, MD  [facility] [phone] (phone) [phone] (fax)  [facility]

## 2022-10-01 ENCOUNTER — Encounter: Payer: Self-pay | Admitting: Family Medicine

## 2022-10-01 ENCOUNTER — Ambulatory Visit (INDEPENDENT_AMBULATORY_CARE_PROVIDER_SITE_OTHER): Payer: Federal, State, Local not specified - PPO | Admitting: Family Medicine

## 2022-10-01 VITALS — BP 121/78 | HR 84 | Temp 96.8°F | Ht 61.0 in | Wt 165.0 lb

## 2022-10-01 DIAGNOSIS — M7989 Other specified soft tissue disorders: Secondary | ICD-10-CM

## 2022-10-01 DIAGNOSIS — Z808 Family history of malignant neoplasm of other organs or systems: Secondary | ICD-10-CM

## 2022-10-01 DIAGNOSIS — Z1159 Encounter for screening for other viral diseases: Secondary | ICD-10-CM

## 2022-10-01 DIAGNOSIS — Z0000 Encounter for general adult medical examination without abnormal findings: Secondary | ICD-10-CM | POA: Diagnosis not present

## 2022-10-01 DIAGNOSIS — Z114 Encounter for screening for human immunodeficiency virus [HIV]: Secondary | ICD-10-CM

## 2022-10-01 DIAGNOSIS — Z803 Family history of malignant neoplasm of breast: Secondary | ICD-10-CM

## 2022-10-01 DIAGNOSIS — Z13220 Encounter for screening for lipoid disorders: Secondary | ICD-10-CM | POA: Diagnosis not present

## 2022-10-01 DIAGNOSIS — Z130 Encounter for screening for diseases of the blood and blood-forming organs and certain disorders involving the immune mechanism: Secondary | ICD-10-CM | POA: Diagnosis not present

## 2022-10-01 DIAGNOSIS — L308 Other specified dermatitis: Secondary | ICD-10-CM

## 2022-10-01 DIAGNOSIS — E669 Obesity, unspecified: Secondary | ICD-10-CM

## 2022-10-01 DIAGNOSIS — R030 Elevated blood-pressure reading, without diagnosis of hypertension: Secondary | ICD-10-CM | POA: Diagnosis not present

## 2022-10-01 DIAGNOSIS — Z131 Encounter for screening for diabetes mellitus: Secondary | ICD-10-CM | POA: Diagnosis not present

## 2022-10-01 DIAGNOSIS — F411 Generalized anxiety disorder: Secondary | ICD-10-CM | POA: Diagnosis not present

## 2022-10-01 MED ORDER — TRIAMCINOLONE ACETONIDE 0.1 % EX CREA: 1.0000 | TOPICAL_CREAM | Freq: Two times a day (BID) | CUTANEOUS | 3 refills | Status: DC

## 2022-10-01 NOTE — Assessment & Plan Note (Signed)
Patient reports lower extremity swelling, particularly in response to dietary sodium intake and prolonged periods of sitting due to work-related travel. No associated symptoms of heart failure or kidney disease reported. -Advise to continue low sodium diet. -Recommend use of compression stockings, particularly during periods of prolonged sitting. -Order metabolic panel to assess kidney function.

## 2022-10-01 NOTE — Assessment & Plan Note (Signed)
Chronic conditions are stable  Patient was counseled on benefits of regular physical activity with goal of 150 minutes of moderate to vigurous intensity 4 days per week  Patient was counseled to consume well balanced diet of fruits, vegetables, limited saturated fats and limited sugary foods and beverages with emphasis on consuming 6-8 glasses of water daily  Screening recommended today: A1c, lipids,Hep C,HIV Vaccines recommended today: COVID

## 2022-10-01 NOTE — Assessment & Plan Note (Signed)
Patient reports significant family history of multiple cancers, including breast and ovarian cancer. Patient has personal history of ovarian issues. -Order BRCA gene testing to assess for hereditary cancer risk.

## 2022-10-01 NOTE — Assessment & Plan Note (Signed)
Patient reports history of eczema with current need for topical treatment. -Prescribe Triamcinolone 0.1%, 45g with refills, to be applied as needed for eczema flares.

## 2022-10-01 NOTE — Assessment & Plan Note (Signed)
BMI 31.18 Recommended continued low sodium and protein rich diet as well as 150 mins of physical activity per week for weight management

## 2022-10-01 NOTE — Assessment & Plan Note (Signed)
Resolved, BP within normal limits  CMP collected today

## 2022-10-01 NOTE — Assessment & Plan Note (Signed)
Pap smears with GYN as previously scheduled  HIV and Hep C collected today

## 2022-10-08 ENCOUNTER — Encounter: Payer: Self-pay | Admitting: Family Medicine

## 2022-10-09 MED ORDER — CLOBETASOL PROPIONATE 0.05 % EX OINT: 1.0000 | TOPICAL_OINTMENT | Freq: Two times a day (BID) | CUTANEOUS | 0 refills | Status: DC

## 2022-10-22 DIAGNOSIS — F411 Generalized anxiety disorder: Secondary | ICD-10-CM | POA: Diagnosis not present

## 2022-10-29 LAB — COMPREHENSIVE METABOLIC PANEL
ALT: 14 IU/L (ref 0–32)
AST: 14 IU/L (ref 0–40)
Albumin: 3.9 g/dL (ref 3.9–4.9)
Alkaline Phosphatase: 72 IU/L (ref 44–121)
BUN/Creatinine Ratio: 13 (ref 9–23)
BUN: 11 mg/dL (ref 6–20)
Bilirubin Total: 0.4 mg/dL (ref 0.0–1.2)
CO2: 21 mmol/L (ref 20–29)
Calcium: 9 mg/dL (ref 8.7–10.2)
Chloride: 104 mmol/L (ref 96–106)
Creatinine, Ser: 0.88 mg/dL (ref 0.57–1.00)
Globulin, Total: 2.6 g/dL (ref 1.5–4.5)
Glucose: 87 mg/dL (ref 70–99)
Potassium: 4.4 mmol/L (ref 3.5–5.2)
Sodium: 138 mmol/L (ref 134–144)
Total Protein: 6.5 g/dL (ref 6.0–8.5)
eGFR: 87 mL/min/{1.73_m2} (ref >59)

## 2022-10-29 LAB — LIPID PANEL WITH LDL/HDL RATIO
Cholesterol, Total: 158 mg/dL (ref 100–199)
HDL: 47 mg/dL (ref >39)
LDL Chol Calc (NIH): 97 mg/dL (ref 0–99)
LDL/HDL Ratio: 2.1 ratio (ref 0.0–3.2)
Triglycerides: 69 mg/dL (ref 0–149)
VLDL Cholesterol Cal: 14 mg/dL (ref 5–40)

## 2022-10-29 LAB — CBC WITH DIFFERENTIAL/PLATELET
Basophils Absolute: 0 10*3/uL (ref 0.0–0.2)
Basos: 1 %
EOS (ABSOLUTE): 0.4 10*3/uL (ref 0.0–0.4)
Eos: 7 %
Hematocrit: 39.5 % (ref 34.0–46.6)
Hemoglobin: 12.9 g/dL (ref 11.1–15.9)
Immature Grans (Abs): 0 10*3/uL (ref 0.0–0.1)
Immature Granulocytes: 0 %
Lymphocytes Absolute: 1.8 10*3/uL (ref 0.7–3.1)
Lymphs: 29 %
MCH: 28.4 pg (ref 26.6–33.0)
MCHC: 32.7 g/dL (ref 31.5–35.7)
MCV: 87 fL (ref 79–97)
Monocytes Absolute: 0.5 10*3/uL (ref 0.1–0.9)
Monocytes: 8 %
Neutrophils Absolute: 3.3 10*3/uL (ref 1.4–7.0)
Neutrophils: 55 %
Platelets: 383 10*3/uL (ref 150–450)
RBC: 4.55 x10E6/uL (ref 3.77–5.28)
RDW: 13.6 % (ref 11.7–15.4)
WBC: 6 10*3/uL (ref 3.4–10.8)

## 2022-10-29 LAB — BRCASSURE COMPREHENSIVE PANEL

## 2022-10-29 LAB — HEMOGLOBIN A1C
Est. average glucose Bld gHb Est-mCnc: 111 mg/dL
Hgb A1c MFr Bld: 5.5 % (ref 4.8–5.6)

## 2022-10-29 LAB — HIV ANTIBODY (ROUTINE TESTING W REFLEX): HIV Screen 4th Generation wRfx: NONREACTIVE

## 2022-10-29 LAB — TSH+T4F+T3FREE
Free T4: 0.98 ng/dL (ref 0.82–1.77)
T3, Free: 3.1 pg/mL (ref 2.0–4.4)
TSH: 1.75 u[IU]/mL (ref 0.450–4.500)

## 2022-10-29 LAB — HEPATITIS C ANTIBODY: Hep C Virus Ab: NONREACTIVE

## 2022-11-05 DIAGNOSIS — F411 Generalized anxiety disorder: Secondary | ICD-10-CM | POA: Diagnosis not present

## 2022-11-07 ENCOUNTER — Ambulatory Visit: Payer: Federal, State, Local not specified - PPO | Admitting: Allergy

## 2022-11-22 ENCOUNTER — Encounter: Payer: Self-pay | Admitting: Family Medicine

## 2022-11-22 ENCOUNTER — Other Ambulatory Visit: Payer: Self-pay | Admitting: Family Medicine

## 2022-11-22 DIAGNOSIS — Z803 Family history of malignant neoplasm of breast: Secondary | ICD-10-CM

## 2022-11-22 DIAGNOSIS — Z808 Family history of malignant neoplasm of other organs or systems: Secondary | ICD-10-CM

## 2022-11-23 DIAGNOSIS — G8929 Other chronic pain: Secondary | ICD-10-CM | POA: Diagnosis not present

## 2022-11-23 DIAGNOSIS — R102 Pelvic and perineal pain: Secondary | ICD-10-CM | POA: Diagnosis not present

## 2022-11-23 DIAGNOSIS — M7918 Myalgia, other site: Secondary | ICD-10-CM | POA: Diagnosis not present

## 2022-11-23 DIAGNOSIS — Z01419 Encounter for gynecological examination (general) (routine) without abnormal findings: Secondary | ICD-10-CM | POA: Diagnosis not present

## 2022-11-23 DIAGNOSIS — M5442 Lumbago with sciatica, left side: Secondary | ICD-10-CM | POA: Diagnosis not present

## 2022-11-23 DIAGNOSIS — N809 Endometriosis, unspecified: Secondary | ICD-10-CM | POA: Diagnosis not present

## 2022-11-23 DIAGNOSIS — M533 Sacrococcygeal disorders, not elsewhere classified: Secondary | ICD-10-CM | POA: Diagnosis not present

## 2022-11-27 DIAGNOSIS — F411 Generalized anxiety disorder: Secondary | ICD-10-CM | POA: Diagnosis not present

## 2022-12-11 DIAGNOSIS — F411 Generalized anxiety disorder: Secondary | ICD-10-CM | POA: Diagnosis not present

## 2022-12-13 ENCOUNTER — Encounter: Payer: Self-pay | Admitting: Allergy

## 2022-12-13 ENCOUNTER — Ambulatory Visit: Payer: Federal, State, Local not specified - PPO | Admitting: Allergy

## 2022-12-13 ENCOUNTER — Other Ambulatory Visit: Payer: Self-pay

## 2022-12-13 VITALS — BP 130/82 | HR 74 | Temp 98.3°F | Resp 18

## 2022-12-13 DIAGNOSIS — H1013 Acute atopic conjunctivitis, bilateral: Secondary | ICD-10-CM | POA: Diagnosis not present

## 2022-12-13 DIAGNOSIS — T781XXD Other adverse food reactions, not elsewhere classified, subsequent encounter: Secondary | ICD-10-CM

## 2022-12-13 DIAGNOSIS — L308 Other specified dermatitis: Secondary | ICD-10-CM | POA: Diagnosis not present

## 2022-12-13 DIAGNOSIS — J3089 Other allergic rhinitis: Secondary | ICD-10-CM | POA: Diagnosis not present

## 2022-12-13 DIAGNOSIS — J302 Other seasonal allergic rhinitis: Secondary | ICD-10-CM

## 2022-12-13 MED ORDER — OLOPATADINE HCL 0.2 % OP SOLN: 1.0000 [drp] | Freq: Every day | OPHTHALMIC | 5 refills | Status: DC | PRN

## 2022-12-13 MED ORDER — LEVOCETIRIZINE DIHYDROCHLORIDE 5 MG PO TABS: 5.0000 mg | ORAL_TABLET | Freq: Every evening | ORAL | 5 refills | Status: DC

## 2022-12-13 MED ORDER — MONTELUKAST SODIUM 10 MG PO TABS: 10.0000 mg | ORAL_TABLET | Freq: Every day | ORAL | 5 refills | Status: DC

## 2022-12-13 MED ORDER — CLOBETASOL PROPIONATE 0.05 % EX OINT: 1.0000 | TOPICAL_OINTMENT | Freq: Two times a day (BID) | CUTANEOUS | 5 refills | Status: DC | PRN

## 2022-12-13 MED ORDER — ELIDEL 1 % EX CREA: TOPICAL_CREAM | Freq: Two times a day (BID) | CUTANEOUS | 5 refills | Status: DC | PRN

## 2022-12-13 MED ORDER — TRIAMCINOLONE ACETONIDE 0.1 % EX CREA: 1.0000 | TOPICAL_CREAM | Freq: Two times a day (BID) | CUTANEOUS | 5 refills | Status: DC | PRN

## 2022-12-13 NOTE — Patient Instructions (Addendum)
-   Continue avoidance measures for grasses, ragweed, weeds, trees, cat, and cockroach - Continue with:  Xyzal (levocetirizine) 5mg  tablet once daily Singulair (montelukast) 10mg  daily at bedtime. Continue steam treatments to help with nasal symptoms.  Pataday (olopatadine) one drop per eye daily as needed for itchy/watery eyes.  - You can use an extra dose of the antihistamine, if needed, for breakthrough symptoms.  - Consider allergy shots as a means of long-term control if medication management is not effective enough.  Allergy shots "re-train" and "reset" the immune system to ignore environmental allergens and decrease the resulting immune response to those allergens (sneezing, itchy watery eyes, runny nose, nasal congestion, etc).  Allergy shots improve symptoms in 75-85% of patients. If interested in this therapy let me know.   - Bathe and soak for 5-10 minutes in warm water once a day. Pat dry.  Immediately apply the below cream prescribed to flared areas (red, irritated, dry, itchy, patchy, scaly, flaky) only. Wait several minutes and then apply your moisturizer all over.    To affected areas on the body (below the face and neck), apply: Triamcinolone 0.1 % ointment twice a day as needed for milder flares.  This is a steroid ointment.  Clobetasol ointment twice day as needed for moderate to severe flares.  This is a steroid ointment.  Elidel or Protopic (which ever is covered) twice a day as needed.  This is a non-steroid ointment that can be used anywhere on the body.  Can be used alone or layered with your steroid ointments With steroid ointments be careful to avoid the armpits and groin area. - Make a note of any foods that make eczema worse. - Dr Onalee Hua, dermatologist, appt for Feb 2025  - Food skin testing was positive to shrimp and shellfish mix.  Negative to milk and fish.    Milk is likely more of an intolerance.  - Continue avoidance of shellfish. - Recommend you have access to  self-injectable epinephrine (Epipen or AuviQ) 0.3mg  in case of allergic reaction - Follow emergency action plan in case of allergic reaction   Allergy: food allergy is when you have eaten a food, developed an allergic reaction after eating the food and have IgE to the food (positive food testing either by skin testing or blood testing).  Food allergy could lead to life threatening symptoms  Sensitivity: occurs when you have IgE to a food (positive food testing either by skin testing or blood testing) but is a food you eat without any issues.  This is not an allergy and we recommend keeping the food in the diet  Intolerance: this is when you have negative testing by either skin testing or blood testing thus not allergic but the food causes symptoms (like belly pain, bloating, diarrhea etc) with ingestion.  These foods should be avoided to prevent symptoms.     Follow-up in 6 months or sooner if needed

## 2022-12-13 NOTE — Progress Notes (Signed)
Follow-up Note  RE: Destiny Simpson MRN: [id number] DOB: 11-01-1985 Date of Office Visit: 12/13/2022   History of present illness: Destiny Simpson is a 37 y.o. female presenting today for follow-up of allergic rhinitis with conjunctivitis, atopic dermatitis, adverse food reaction.  She was last seen in the office on 07/06/22 by myself.    Discussed the use of AI scribe software for clinical note transcription with the patient, who gave verbal consent to proceed.   She reports an improvement in her allergy symptoms since the last visit in May, attributing this to the medication regimen.  She is using the Xyzal and Singulair daily and will use Pataday as needed for her eye symptoms. She did well with her eczema has been a little bit worse despite using clobetasol mostly and has triamcinolone ointments as well.  She describes her skin as being 'super inflamed' and 'horrible right now' especially behind the knee and in the armpit area.  She has been using vanicream soap and deodorant in an attempt to manage the irritation, particularly in the underarm area and the back of her legs. She has an appointment with a dermatologist scheduled for February 2025 which was the earliest she could get in.  She continues to avoid shellfish in the diet.  She states her OB would like her to eat more nuts in the diet and she was not sure if she should given her pollen allergy.  We did her testing we did not test for nuts as this was not a concern at that time.  She states she has not had any symptoms with eating nuts in the past. She also mentions a problem with ear wax build-up, which she manages at home using a kit ordered from [employer]. She has not used the kit this month but plans to do so.   Review of systems: 10pt ROS negative unless noted above in HPI  All other systems negative unless noted above in HPI  Past medical/social/surgical/family history have been reviewed and are unchanged unless specifically  indicated below.  No changes  Medication List: Current Outpatient Medications  Medication Sig Dispense Refill   clobetasol ointment (TEMOVATE) 0.05 % Apply 1 Application topically 2 (two) times daily. 30 g 0   diazepam (VALIUM) 5 MG tablet Take 5 mg by mouth at bedtime.     EPINEPHrine (AUVI-Q) 0.3 mg/0.3 mL IJ SOAJ injection Inject 0.3 mg into the muscle as needed for anaphylaxis. 2 each 2   JUNEL FE 1.5/30 1.5-30 MG-MCG tablet Take 1 tablet by mouth daily. 28 tablet 11   levocetirizine (XYZAL) 5 MG tablet Take 1 tablet (5 mg total) by mouth every evening. 30 tablet 5   montelukast (SINGULAIR) 10 MG tablet Take 1 tablet (10 mg total) by mouth at bedtime. 30 tablet 5   Olopatadine HCl 0.2 % SOLN Apply 1 drop to eye daily as needed (itchy/watery eyes). 2.5 mL 5   triamcinolone cream (KENALOG) 0.1 % Apply 1 Application topically 2 (two) times daily. 45 g 3   Azelaic Acid (FINACEA) 15 % cream Apply 1 application  topically every morning. After skin is thoroughly washed and patted dry, gently but thoroughly massage a thin film of azelaic acid cream into the affected area twice daily, in the morning and evening. (Patient not taking: Reported on 12/13/2022)     No current facility-administered medications for this visit.     Known medication allergies: Allergies  Allergen Reactions   [place] Grass  Cat Hair Extract    Pepto-Bismol [Bismuth Subsalicylate] Hives   Shellfish Allergy    Shrimp (Diagnostic)    Sweet Gum Allergy Skin Test      Physical examination: Blood pressure 130/82, pulse 74, temperature 98.3 F (36.8 C), temperature source Temporal, resp. rate 18, SpO2 99%.  General: Alert, interactive, in no acute distress. HEENT: PERRLA, TMs pearly gray, turbinates minimally edematous without discharge, post-pharynx non erythematous. Neck: Supple without lymphadenopathy. Lungs: Clear to auscultation without wheezing, rhonchi or rales. {no increased work of breathing. CV: Normal  S1, S2 without murmurs. Abdomen: Nondistended, nontender. Skin: Visualised skin is warm and dry, without lesions or rashes. Extremities:  No clubbing, cyanosis or edema. Neuro:   Grossly intact.  Diagnositics/Labs: None today  Assessment and plan: Allergic rhinitis with conjunctivitis  - Continue avoidance measures for grasses, ragweed, weeds, trees, cat, and cockroach - Continue with:  Xyzal (levocetirizine) 5mg  tablet once daily Singulair (montelukast) 10mg  daily at bedtime. Continue steam treatments to help with nasal symptoms.  Pataday (olopatadine) one drop per eye daily as needed for itchy/watery eyes.  - You can use an extra dose of the antihistamine, if needed, for breakthrough symptoms.  - Consider allergy shots as a means of long-term control if medication management is not effective enough.  Allergy shots "re-train" and "reset" the immune system to ignore environmental allergens and decrease the resulting immune response to those allergens (sneezing, itchy watery eyes, runny nose, nasal congestion, etc).  Allergy shots improve symptoms in 75-85% of patients. If interested in this therapy let me know.  Provided informational handouts on immunotherapy.  Atopic dermatitis - Bathe and soak for 5-10 minutes in warm water once a day. Pat dry.  Immediately apply the below cream prescribed to flared areas (red, irritated, dry, itchy, patchy, scaly, flaky) only. Wait several minutes and then apply your moisturizer all over.    To affected areas on the body (below the face and neck), apply: Triamcinolone 0.1 % ointment twice a day as needed for milder flares.  This is a steroid ointment.  Clobetasol ointment twice day as needed for moderate to severe flares.  This is a steroid ointment.  Elidel or Protopic (which ever is covered) twice a day as needed.  This is a non-steroid ointment that can be used anywhere on the body.  Can be used alone or layered with your steroid ointments With  steroid ointments be careful to avoid the armpits and groin area. - Make a note of any foods that make eczema worse. - Dr Onalee Hua, dermatologist, appt for Feb 2025  Adverse food reaction - Food skin testing was positive to shrimp and shellfish mix.  Negative to milk and fish.    Milk is likely more of an intolerance.  - Continue avoidance of shellfish. - Recommend you have access to self-injectable epinephrine (Epipen or AuviQ) 0.3mg  in case of allergic reaction - Follow emergency action plan in case of allergic reaction   Allergy: food allergy is when you have eaten a food, developed an allergic reaction after eating the food and have IgE to the food (positive food testing either by skin testing or blood testing).  Food allergy could lead to life threatening symptoms  Sensitivity: occurs when you have IgE to a food (positive food testing either by skin testing or blood testing) but is a food you eat without any issues.  This is not an allergy and we recommend keeping the food in the diet  Intolerance: this is when you  have negative testing by either skin testing or blood testing thus not allergic but the food causes symptoms (like belly pain, bloating, diarrhea etc) with ingestion.  These foods should be avoided to prevent symptoms.    Discussed I do not have any reason for her to not tolerate nuts in the diet.  I did advise that having a tree pollen allergy to tree pollens like walnut or pecan does not equate to having a food allergy to tree nuts.  Follow-up in 6 months or sooner if needed  I appreciate the opportunity to take part in Belmira's care. Please do not hesitate to contact me with questions.  Sincerely,   Margo Aye, MD Allergy/Immunology [facility]

## 2022-12-26 DIAGNOSIS — M533 Sacrococcygeal disorders, not elsewhere classified: Secondary | ICD-10-CM | POA: Diagnosis not present

## 2022-12-26 DIAGNOSIS — M5442 Lumbago with sciatica, left side: Secondary | ICD-10-CM | POA: Diagnosis not present

## 2022-12-26 DIAGNOSIS — G8929 Other chronic pain: Secondary | ICD-10-CM | POA: Diagnosis not present

## 2023-01-03 DIAGNOSIS — F411 Generalized anxiety disorder: Secondary | ICD-10-CM | POA: Diagnosis not present

## 2023-01-14 ENCOUNTER — Encounter: Payer: Self-pay | Admitting: Allergy

## 2023-01-15 ENCOUNTER — Other Ambulatory Visit: Payer: Self-pay

## 2023-01-15 MED ORDER — PROTOPIC 0.1 % EX OINT: TOPICAL_OINTMENT | Freq: Two times a day (BID) | CUTANEOUS | 0 refills | Status: DC

## 2023-01-17 DIAGNOSIS — F411 Generalized anxiety disorder: Secondary | ICD-10-CM | POA: Diagnosis not present

## 2023-01-17 MED ORDER — PROTOPIC 0.1 % EX OINT: TOPICAL_OINTMENT | Freq: Two times a day (BID) | CUTANEOUS | 2 refills | Status: AC

## 2023-01-17 NOTE — Addendum Note (Signed)
Addended by: Maryjean Morn D on: 01/17/2023 11:01 AM   Modules accepted: Orders

## 2023-01-22 ENCOUNTER — Encounter: Payer: Self-pay | Admitting: Family Medicine

## 2023-01-22 ENCOUNTER — Telehealth (INDEPENDENT_AMBULATORY_CARE_PROVIDER_SITE_OTHER): Payer: Federal, State, Local not specified - PPO | Admitting: Family Medicine

## 2023-01-22 DIAGNOSIS — M545 Low back pain, unspecified: Secondary | ICD-10-CM | POA: Diagnosis not present

## 2023-01-22 MED ORDER — GABAPENTIN 100 MG PO CAPS: 100.0000 mg | ORAL_CAPSULE | Freq: Every day | ORAL | 0 refills | Status: DC

## 2023-01-22 MED ORDER — TIZANIDINE HCL 4 MG PO TABS: 4.0000 mg | ORAL_TABLET | Freq: Four times a day (QID) | ORAL | 0 refills | Status: DC | PRN

## 2023-01-22 MED ORDER — MELOXICAM 15 MG PO TABS: 15.0000 mg | ORAL_TABLET | Freq: Every day | ORAL | 0 refills | Status: AC

## 2023-01-22 NOTE — Telephone Encounter (Signed)
I called patient and scheduled her for a virtual visit with Dr. Roxan Hockey today at 2:20pm. She is appreciative for the assistance.

## 2023-01-22 NOTE — Progress Notes (Signed)
MyChart Video Visit    Virtual Visit via Video Note   This format is felt to be most appropriate for this patient at this time. Physical exam was limited by quality of the video and audio technology used for the visit.   Patient location: Patient's home address   Provider location: [facility]  [address]  [place], [place] [address]   I discussed the limitations of evaluation and management by telemedicine and the availability of in person appointments. The patient expressed understanding and agreed to proceed.  Patient: Destiny Simpson   DOB: December 04, 1985   37 y.o. Female  MRN: [id number] Visit Date: 01/22/2023  Today's healthcare provider: Ronnald Ramp, MD   No chief complaint on file.  Subjective    HPI   Discussed the use of AI scribe software for clinical note transcription with the patient, who gave verbal consent to proceed.  History of Present Illness   The patient is a 37 year old female who is currently on a work assignment in [place]. She presents with lower back pain, which she attributes to uncomfortable hotel beds and office chairs. The pain has been ongoing for two weeks, during which she has been using ibuprofen and heating pads with little relief. The discomfort is localized to the lower back, with no radiation of pain to the legs or any associated urinary symptoms. There is no history of injury or trauma to the back. The patient has a history of SI joint dysfunction, but denies any current symptoms related to this condition. She is scheduled to follow up with a spine specialist in the near future. The patient has an allergy to a component of Pepto-Bismol, which previously caused hives, but she tolerates ibuprofen well.          Past Medical History:  Diagnosis Date   Abnormal Pap smear    Allergy    Angio-edema    Anxiety    Asthma    Eczema    Hypertension 09/21/2021   Ovarian cyst    Urticaria      Medications: Outpatient Medications Prior to Visit  Medication Sig   Azelaic Acid (FINACEA) 15 % cream Apply 1 application  topically every morning. After skin is thoroughly washed and patted dry, gently but thoroughly massage a thin film of azelaic acid cream into the affected area twice daily, in the morning and evening. (Patient not taking: Reported on 12/13/2022)   clobetasol ointment (TEMOVATE) 0.05 % Apply 1 Application topically 2 (two) times daily as needed.   diazepam (VALIUM) 5 MG tablet Take 5 mg by mouth at bedtime.   ELIDEL 1 % cream Apply topically 2 (two) times daily as needed.   EPINEPHrine (AUVI-Q) 0.3 mg/0.3 mL IJ SOAJ injection Inject 0.3 mg into the muscle as needed for anaphylaxis.   JUNEL FE 1.5/30 1.5-30 MG-MCG tablet Take 1 tablet by mouth daily.   levocetirizine (XYZAL) 5 MG tablet Take 1 tablet (5 mg total) by mouth every evening.   montelukast (SINGULAIR) 10 MG tablet Take 1 tablet (10 mg total) by mouth at bedtime.   Olopatadine HCl 0.2 % SOLN Apply 1 drop to eye daily as needed (itchy/watery eyes).   PROTOPIC 0.1 % ointment Apply topically 2 (two) times daily.   triamcinolone cream (KENALOG) 0.1 % Apply 1 Application topically 2 (two) times daily as needed.   No facility-administered medications prior to visit.    Review of Systems  Last metabolic panel Lab Results  Component Value  Date   GLUCOSE 87 10/01/2022   NA 138 10/01/2022   K 4.4 10/01/2022   CL 104 10/01/2022   CO2 21 10/01/2022   BUN 11 10/01/2022   CREATININE 0.88 10/01/2022   EGFR 87 10/01/2022   CALCIUM 9.0 10/01/2022   PROT 6.5 10/01/2022   ALBUMIN 3.9 10/01/2022   LABGLOB 2.6 10/01/2022   BILITOT 0.4 10/01/2022   ALKPHOS 72 10/01/2022   AST 14 10/01/2022   ALT 14 10/01/2022        Objective    There were no vitals taken for this visit.  BP Readings from Last 3 Encounters:  12/13/22 130/82  10/01/22 121/78  07/30/22 131/86   Wt Readings from Last 3 Encounters:   10/01/22 165 lb (74.8 kg)  07/30/22 167 lb 8 oz (76 kg)  07/06/22 160 lb 14.4 oz (73 kg)        Physical Exam  MSK: back does not appear to have any injuries, no erythema visualized during virtual exam, patient is wearing adhesive pain patch during visit   Assessment & Plan     Problem List Items Addressed This Visit   None Visit Diagnoses     Acute bilateral low back pain without sciatica    -  Primary   Relevant Medications   meloxicam (MOBIC) 15 MG tablet   tiZANidine (ZANAFLEX) 4 MG tablet   gabapentin (NEURONTIN) 100 MG capsule          Lower Back Pain Chronic lower back pain exacerbated by prolonged sitting and soft beds. Pain unrelieved by ibuprofen and heating pads. No radiculopathy, bladder dysfunction, or numbness. No skin abnormalities. Meloxicam discussed as a more effective alternative to ibuprofen. Advised to avoid NSAIDs while on meloxicam to prevent kidney strain. Tizanidine prescribed for muscle relaxation with minimal sedation. Gabapentin prescribed for additional pain relief at a low dose to minimize side effects. 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 Follow-up with spine specialist scheduled. - Follow up with spine specialist as scheduled  Follow-up - Follow up with spine specialist on Monday - Monitor response to prescribed medications and adjust treatment as necessary.         No follow-ups on file.     I discussed the assessment and treatment plan with the patient. The patient was provided an opportunity to ask questions and all  were answered. The patient agreed with the plan and demonstrated an understanding of the instructions.   The patient was advised to call back or seek an in-person evaluation if the symptoms worsen or if the condition fails to improve as anticipated.  I provided 15 minutes of non-face-to-face time during this encounter.   Ronnald Ramp, MD [facility] [phone] (phone) [phone] (fax)  [facility]

## 2023-01-28 DIAGNOSIS — M7918 Myalgia, other site: Secondary | ICD-10-CM | POA: Diagnosis not present

## 2023-01-28 DIAGNOSIS — Z79899 Other long term (current) drug therapy: Secondary | ICD-10-CM | POA: Diagnosis not present

## 2023-01-28 DIAGNOSIS — G8929 Other chronic pain: Secondary | ICD-10-CM | POA: Diagnosis not present

## 2023-01-28 DIAGNOSIS — M545 Low back pain, unspecified: Secondary | ICD-10-CM | POA: Diagnosis not present

## 2023-01-28 DIAGNOSIS — M533 Sacrococcygeal disorders, not elsewhere classified: Secondary | ICD-10-CM | POA: Diagnosis not present

## 2023-01-29 DIAGNOSIS — F411 Generalized anxiety disorder: Secondary | ICD-10-CM | POA: Diagnosis not present

## 2023-02-11 DIAGNOSIS — F411 Generalized anxiety disorder: Secondary | ICD-10-CM | POA: Diagnosis not present

## 2023-02-25 DIAGNOSIS — F411 Generalized anxiety disorder: Secondary | ICD-10-CM | POA: Diagnosis not present

## 2023-02-26 DIAGNOSIS — M533 Sacrococcygeal disorders, not elsewhere classified: Secondary | ICD-10-CM | POA: Diagnosis not present

## 2023-02-26 DIAGNOSIS — M7918 Myalgia, other site: Secondary | ICD-10-CM | POA: Diagnosis not present

## 2023-02-26 DIAGNOSIS — N809 Endometriosis, unspecified: Secondary | ICD-10-CM | POA: Diagnosis not present

## 2023-03-18 ENCOUNTER — Other Ambulatory Visit: Payer: Self-pay | Admitting: Family Medicine

## 2023-03-28 ENCOUNTER — Encounter: Payer: Self-pay | Admitting: Dermatology

## 2023-03-28 ENCOUNTER — Ambulatory Visit: Payer: Federal, State, Local not specified - PPO | Admitting: Dermatology

## 2023-03-28 ENCOUNTER — Other Ambulatory Visit: Payer: Self-pay | Admitting: Dermatology

## 2023-03-28 VITALS — BP 137/90 | HR 107

## 2023-03-28 DIAGNOSIS — L209 Atopic dermatitis, unspecified: Secondary | ICD-10-CM

## 2023-03-28 DIAGNOSIS — L732 Hidradenitis suppurativa: Secondary | ICD-10-CM | POA: Diagnosis not present

## 2023-03-28 MED ORDER — ZORYVE 0.15 % EX CREA: 1.0000 | TOPICAL_CREAM | Freq: Two times a day (BID) | CUTANEOUS | 6 refills | Status: DC

## 2023-03-28 NOTE — Patient Instructions (Addendum)
Hello Destiny Simpson,  Thank you for visiting my office today. Your dedication to managing your eczema and exploring various treatment options is greatly appreciated. Below is a summary of our discussion and your personalized treatment plan:  Medications:   Zoryve Cream: Apply daily to the affected areas until symptoms clear. This medication is designed to help repair the skin barrier and is safe for everyday use.     Dupixent: Should Zoryve not provide consistent relief, consider Dupixent as a long-term treatment option. This injectable biologic may offer a more sustained solution.  Skincare Routine:   Vanicream Body Wash: Continue utilizing this product for your daily cleansing routine to maintain skin health.   Vanicream Deodorant (blue top): Use daily for personal hygiene. For additional odor control, consider applying CeraVe's benzoyl peroxide wash to the underarms and groin area before showering.   CeraVe Benzoyl Peroxide Wash: Begin with a 4% concentration suitable for sensitive skin. Samples of a 10% concentration may be available for you to trial.  Pharmacy Coordination:   Zoryve Prescription: Your prescription has been sent to [facility]. They will be in touch regarding insurance details and copay arrangements. Should the copay be prohibitively high, please contact us via MyChart for potential adjustments.   Samples: Zoryve samples have been provided to you for an immediate start.  Follow-Up Appointment:   Please schedule a follow-up appointment in four months, or sooner if you experience a severe flare-up.  Adhere to the treatment plan as outlined, and should you have any questions or concerns, do not hesitate to reach out to my office.  Warm regards,  Dr. Langston Reusing, Dermatology   Important Information   Due to recent changes in healthcare laws, you may see results of your pathology and/or laboratory studies on MyChart before the doctors have had a chance to review them.  We understand that in some cases there may be results that are confusing or concerning to you. Please understand that not all results are received at the same time and often the doctors may need to interpret multiple results in order to provide you with the best plan of care or course of treatment. Therefore, we ask that you please give [place] 2 business days to thoroughly review all your results before contacting the office for clarification. Should we see a critical lab result, you will be contacted sooner.     If You Need Anything After Your Visit   If you have any questions or concerns for your doctor, please call our main line at [phone]. If no one answers, please leave a voicemail as directed and we will return your call as soon as possible. Messages left after 4 pm will be answered the following business day.    You may also send [place] a message via MyChart. We typically respond to MyChart messages within 1-2 business days.  For prescription refills, please ask your pharmacy to contact our office. Our fax number is [phone].  If you have an urgent issue when the clinic is closed that cannot wait until the next business day, you can page your doctor at the number below.     Please note that while we do our best to be available for urgent issues outside of office hours, we are not available 24/7.    If you have an urgent issue and are unable to reach [place], you may choose to seek medical care at your doctor's office, retail clinic, urgent care center, or emergency room.   If you have  a medical emergency, please immediately call 911 or go to the emergency department. In the event of inclement weather, please call our main line at [phone] for an update on the status of any delays or closures.  Dermatology Medication Tips: Please keep the boxes that topical medications come in in order to help keep track of the instructions about where and how to use these. Pharmacies typically print the  medication instructions only on the boxes and not directly on the medication tubes.   If your medication is too expensive, please contact our office at [phone] or send [place] a message through MyChart.    We are unable to tell what your co-pay for medications will be in advance as this is different depending on your insurance coverage. However, we may be able to find a substitute medication at lower cost or fill out paperwork to get insurance to cover a needed medication.    If a prior authorization is required to get your medication covered by your insurance company, please allow [place] 1-2 business days to complete this process.   Drug prices often vary depending on where the prescription is filled and some pharmacies may offer cheaper prices.   The website www.goodrx.com contains coupons for medications through different pharmacies. The prices here do not account for what the cost may be with help from insurance (it may be cheaper with your insurance), but the website can give you the price if you did not use any insurance.  - You can print the associated coupon and take it with your prescription to the pharmacy.  - You may also stop by our office during regular business hours and pick up a GoodRx coupon card.  - If you need your prescription sent electronically to a different pharmacy, notify our office through [facility] or by phone at [phone]

## 2023-03-28 NOTE — Telephone Encounter (Signed)
We can change to Eucrisa ointment.  Thanks!

## 2023-03-28 NOTE — Progress Notes (Signed)
New Patient Visit   Subjective  Destiny Simpson is a 38 y.o. female who presents for the following: Eczema & HS  Patient states she has eczema located at the elbow and lower legs that she would like to have examined. Patient reports the areas have been there for 37 years. She reports the areas are bothersome. Patient reports the areas are very itchy. Patient rates irritation 10 out of 10. She states that the areas have not spread. Patient reports she has previously been treated for these areas (Allergy and Asthma). She has previously tried and failed Clobetasol (last usage was about 3 months ago), TMC, Elidel and otc products (Vani Cream, Aveeno (Made flares worst), aquaphor and Vasoline).   The following portions of the chart were reviewed this encounter and updated as appropriate: medications, allergies, medical history  Review of Systems:  No other skin or systemic complaints except as noted in HPI or Assessment and Plan.  Objective  Well appearing patient in no apparent distress; mood and affect are within normal limits.  A focused examination was performed of the following areas: Elbows, B/L LE  Relevant exam findings are noted in the Assessment and Plan.    Assessment & Plan   ATOPIC DERMATITIS Exam: Scaly hyperpigmented plaques, B/L Shins, Axilla BSA:10% IGA: 3  Flared  Atopic dermatitis (eczema) is a chronic, relapsing, pruritic condition that can significantly affect quality of life. It is often associated with allergic rhinitis and/or asthma and can require treatment with topical medications, phototherapy, or in severe cases biologic injectable medication (Dupixent; Adbry) or Oral JAK inhibitors.  Treatment Plan: - Recommend continuing gentle skin care - We will plan to prescribe Zoryve 0.15% (Samples provided while in office today) - If Zoryve is ineffective we will discuss starting Dupixent at next office visit - We will plan to follow up in 4 Months  Pt is not a  candidate for methotrexate or cyclosporine due to inability for follow up labs and contraindications with other medications. Pt has no access to a light box for phototherapy.  Due to the progressive and chronic nature of her psoriasis, patient has tried and failed numerous topicals creams as noted above, the next best therapeutic option is a systemic therapy. It is medically necessary to help improve her quality of life.  HIDRADENITIS SUPPURATIVA Exam: Doreene Adas Stage 1: Abscess formation (single) without sinus tracts or scarring   Not at goal  Hidradenitis Suppurativa is a chronic; persistent; non-curable, but treatable condition due to abnormal inflamed sweat glands in the body folds (axilla, inframammary, groin, medial thighs), causing recurrent painful draining cysts and scarring. It can be associated with severe scarring acne and cysts; also abscesses and scarring of scalp. The goal is control and prevention of flares, as it is not curable. Scars are permanent and can be thickened. Treatment may include daily use of topical medication and oral antibiotics.  Oral isotretinoin may also be helpful.  For some cases, Humira or Cosentyx (biologic injections) may be prescribed to decrease the inflammatory process and prevent flares.  When indicated, inflamed cysts may also be treated surgically.  Treatment Plan: - Recommended washing daily with Cerave BPO to washing with this daily to keep bacteria down on the body  ATOPIC DERMATITIS, UNSPECIFIED TYPE   Related Medications Roflumilast (ZORYVE) 0.15 % CREA Apply 1 Application topically 2 (two) times daily. HIDRADENITIS SUPPURATIVA    Return in about 4 months (around 07/26/2023) for Eczema & HS F/U.   Documentation: I have reviewed the  above documentation for accuracy and completeness, and I agree with the above.  Stasia Cavalier, am acting as scribe for Langston Reusing, DO.  Langston Reusing, DO

## 2023-04-02 DIAGNOSIS — F411 Generalized anxiety disorder: Secondary | ICD-10-CM | POA: Diagnosis not present

## 2023-04-04 ENCOUNTER — Ambulatory Visit: Payer: Federal, State, Local not specified - PPO | Admitting: Family Medicine

## 2023-04-10 DIAGNOSIS — F411 Generalized anxiety disorder: Secondary | ICD-10-CM | POA: Diagnosis not present

## 2023-04-10 NOTE — Progress Notes (Unsigned)
      Established patient visit   Patient: Destiny Simpson   DOB: 1985-06-18   38 y.o. Female  MRN: [id number] Visit Date: 04/11/2023  Today's healthcare provider: Ronnald Ramp, MD   No chief complaint on file.  Subjective       Discussed the use of AI scribe software for clinical note transcription with the patient, who gave verbal consent to proceed.  History of Present Illness             Past Medical History:  Diagnosis Date   Abnormal Pap smear    Allergy    Angio-edema    Anxiety    Asthma    Eczema    Hypertension 09/21/2021   Ovarian cyst    Urticaria     Medications: Outpatient Medications Prior to Visit  Medication Sig   Azelaic Acid (FINACEA) 15 % cream Apply 1 application  topically every morning. After skin is thoroughly washed and patted dry, gently but thoroughly massage a thin film of azelaic acid cream into the affected area twice daily, in the morning and evening.   clobetasol ointment (TEMOVATE) 0.05 % Apply 1 Application topically 2 (two) times daily as needed.   diazepam (VALIUM) 5 MG tablet Take 5 mg by mouth at bedtime.   ELIDEL 1 % cream Apply topically 2 (two) times daily as needed.   EPINEPHrine (AUVI-Q) 0.3 mg/0.3 mL IJ SOAJ injection Inject 0.3 mg into the muscle as needed for anaphylaxis.   gabapentin (NEURONTIN) 100 MG capsule Take 1 capsule (100 mg total) by mouth at bedtime.   JUNEL FE 1.5/30 1.5-30 MG-MCG tablet TAKE 1 TABLET BY MOUTH EVERY DAY   levocetirizine (XYZAL) 5 MG tablet Take 1 tablet (5 mg total) by mouth every evening.   meloxicam (MOBIC) 15 MG tablet Take 1 tablet (15 mg total) by mouth daily.   montelukast (SINGULAIR) 10 MG tablet Take 1 tablet (10 mg total) by mouth at bedtime.   PROTOPIC 0.1 % ointment Apply topically 2 (two) times daily.   tiZANidine (ZANAFLEX) 4 MG tablet Take 1 tablet (4 mg total) by mouth every 6 (six) hours as needed for muscle spasms.   triamcinolone cream (KENALOG) 0.1 % Apply 1  Application topically 2 (two) times daily as needed.   ZORYVE 0.15 % CREA Apply 1 Application topically 2 (two) times daily.   No facility-administered medications prior to visit.    Review of Systems  {Insert previous labs (optional):23779} {See past labs  Heme  Chem  Endocrine  Serology  Results Review (optional):1}   Objective    There were no vitals taken for this visit. {Insert last BP/Wt (optional):23777}{See vitals history (optional):1}    Physical Exam  ***  No results found for any visits on 04/11/23.  Assessment & Plan     Problem List Items Addressed This Visit       Other   Leg swelling - Primary    Assessment and Plan              No follow-ups on file.         Ronnald Ramp, MD  [facility] [phone] (phone) [phone] (fax)  [facility]

## 2023-04-11 ENCOUNTER — Ambulatory Visit: Payer: Federal, State, Local not specified - PPO | Admitting: Family Medicine

## 2023-04-11 ENCOUNTER — Encounter: Payer: Self-pay | Admitting: Family Medicine

## 2023-04-11 ENCOUNTER — Other Ambulatory Visit (HOSPITAL_COMMUNITY): Payer: Self-pay

## 2023-04-11 ENCOUNTER — Telehealth: Payer: Self-pay | Admitting: Pharmacy Technician

## 2023-04-11 VITALS — BP 133/84 | HR 89 | Ht 61.0 in | Wt 169.0 lb

## 2023-04-11 DIAGNOSIS — I10 Essential (primary) hypertension: Secondary | ICD-10-CM | POA: Diagnosis not present

## 2023-04-11 DIAGNOSIS — N6001 Solitary cyst of right breast: Secondary | ICD-10-CM | POA: Diagnosis not present

## 2023-04-11 DIAGNOSIS — M7989 Other specified soft tissue disorders: Secondary | ICD-10-CM

## 2023-04-11 DIAGNOSIS — E669 Obesity, unspecified: Secondary | ICD-10-CM

## 2023-04-11 DIAGNOSIS — N6002 Solitary cyst of left breast: Secondary | ICD-10-CM

## 2023-04-11 DIAGNOSIS — F5102 Adjustment insomnia: Secondary | ICD-10-CM

## 2023-04-11 DIAGNOSIS — F419 Anxiety disorder, unspecified: Secondary | ICD-10-CM

## 2023-04-11 DIAGNOSIS — Z6831 Body mass index (BMI) 31.0-31.9, adult: Secondary | ICD-10-CM

## 2023-04-11 MED ORDER — TRAZODONE HCL 50 MG PO TABS: 25.0000 mg | ORAL_TABLET | Freq: Every evening | ORAL | 0 refills | Status: DC | PRN

## 2023-04-11 MED ORDER — CONTRAVE 8-90 MG PO TB12: ORAL_TABLET | ORAL | 1 refills | Status: DC

## 2023-04-11 NOTE — Patient Instructions (Addendum)
 [facility] at [facility] [address] [place],  [place]  [address] Main: [phone]   VISIT SUMMARY:  Today, we addressed your concerns about a breast lump, weight management, elevated blood pressure, and sleep difficulties. We discussed a comprehensive plan to investigate and manage these issues, including diagnostic tests, medications, and lifestyle modifications.  YOUR PLAN:  -BREAST NODULE: A breast nodule is a lump in the breast that can be benign or malignant. Given your family history, we will conduct a bilateral diagnostic mammogram and, if necessary, a breast ultrasound to further investigate the nodule.  -HYPERTENSION: Hypertension is high blood pressure, which can be influenced by stress. We will monitor your blood pressure and discuss lifestyle changes to help reduce stress.  -OBESITY: Obesity is a condition where excess body fat affects health. We will start you on Contrave, a medication to aid weight loss, and follow up in 6 weeks to assess your progress. We will also submit a prior authorization for this medication.  -INSOMNIA: Insomnia is difficulty sleeping, often due to anxiety. We will prescribe trazodone to help you sleep better. Please take half a tablet as needed and ensure you have enough time to sleep to avoid daytime drowsiness.  -GENERAL HEALTH MAINTENANCE: We encourage you to continue using your mini treadmill and stepper, even while traveling, and to practice stress management techniques. Maintaining a supportive system is also important for your overall health.  INSTRUCTIONS:  Please follow up in 6 weeks to assess your weight management progress. We will also coordinate with the prior authorization team for Contrave approval. Additionally, schedule your diagnostic mammogram and possible ultrasound as soon as possible.

## 2023-04-11 NOTE — Telephone Encounter (Signed)
 Pharmacy Patient Advocate Encounter   Received notification from Physician's Office that prior authorization for CONTRAVE 8-90 MG TABLETS is required/requested.   Insurance verification completed.   The patient is insured through [employer] [facility] .   Per test claim: PA required; PA submitted to above mentioned insurance via CoverMyMeds Key/confirmation #/EOC [employer] Status is pending

## 2023-04-12 NOTE — Addendum Note (Signed)
 Addended by: Bing Neighbors on: 04/12/2023 08:49 AM   Modules accepted: Orders

## 2023-04-12 NOTE — Telephone Encounter (Signed)
 Please see the message below, the Prior Authorization for Contrave 8-90MG  er tablets has been DENIED.

## 2023-04-12 NOTE — Telephone Encounter (Signed)
 Pharmacy Patient Advocate Encounter  Received notification from [facility] that Prior Authorization for Contrave 8-90MG  er tablets has been DENIED.  Full denial letter will be uploaded to the media tab. See denial reason below.   PA #/Case ID/Reference #: [id number]

## 2023-04-17 ENCOUNTER — Inpatient Hospital Stay
Admission: RE | Admit: 2023-04-17 | Discharge: 2023-04-17 | Disposition: A | Discharge status: Home / Self Care | Payer: Self-pay | Source: Ambulatory Visit | Attending: Family Medicine

## 2023-04-17 ENCOUNTER — Other Ambulatory Visit (HOSPITAL_COMMUNITY): Payer: Self-pay

## 2023-04-17 ENCOUNTER — Other Ambulatory Visit: Payer: Self-pay | Admitting: *Deleted

## 2023-04-17 DIAGNOSIS — F411 Generalized anxiety disorder: Secondary | ICD-10-CM | POA: Diagnosis not present

## 2023-04-17 DIAGNOSIS — Z1231 Encounter for screening mammogram for malignant neoplasm of breast: Secondary | ICD-10-CM

## 2023-04-18 MED ORDER — NALTREXONE HCL 50 MG PO TABS: ORAL_TABLET | ORAL | 0 refills | Status: AC

## 2023-04-18 MED ORDER — BUPROPION HCL ER (SR) 100 MG PO TB12: ORAL_TABLET | ORAL | 0 refills | Status: DC

## 2023-04-18 NOTE — Addendum Note (Signed)
 Addended by: Bing Neighbors on: 04/18/2023 09:21 AM   Modules accepted: Orders

## 2023-04-18 NOTE — Telephone Encounter (Signed)
 Alternative dosing of naltrexone and wellbutrin sent to pharmacy due to contrave denial   Please advise patient to take new prescriptions as prescribed and follow up after 1 month of therapy.   If these two are not covered, she may need to consider phentermine or metformin

## 2023-04-18 NOTE — Telephone Encounter (Signed)
 Yes, she can discontinue the [place].  I sent an rx for Zoryve.  Did the pharmacy change it to [place]? Please call patient and ask.  If so then send in an rx for betamethasone for her to apply BID for 3 weeks and then stop and take a break for 2 weeks.  Repeat until clear. - Dr Onalee Hua

## 2023-04-18 NOTE — Telephone Encounter (Signed)
 Can someone please reach out to offer this pt a one month medication follow up please?   Called and was able to inform the pt of the denied and the new medications that has been sent to her pharmacy on file per Dr Roxan Hockey. She has agreed with the therapy and 1 month follow up.

## 2023-04-19 ENCOUNTER — Other Ambulatory Visit (HOSPITAL_COMMUNITY): Payer: Self-pay

## 2023-04-19 NOTE — Telephone Encounter (Signed)
 Yes, the appt she has scheduled is fine. Thank you

## 2023-04-22 ENCOUNTER — Other Ambulatory Visit: Payer: Self-pay

## 2023-04-22 MED ORDER — BETAMETHASONE DIPROPIONATE 0.05 % EX CREA: TOPICAL_CREAM | CUTANEOUS | 1 refills | Status: DC

## 2023-04-23 NOTE — Telephone Encounter (Signed)
 Ok.  Lets bring her in this week of next to discuss starting dupixent or ebglyss

## 2023-04-30 ENCOUNTER — Ambulatory Visit
Admission: RE | Admit: 2023-04-30 | Discharge: 2023-04-30 | Disposition: A | Discharge status: Home / Self Care | Source: Ambulatory Visit | Attending: Family Medicine | Admitting: Family Medicine

## 2023-04-30 ENCOUNTER — Ambulatory Visit
Admission: RE | Admit: 2023-04-30 | Discharge: 2023-04-30 | Disposition: A | Discharge status: Home / Self Care | Source: Ambulatory Visit | Attending: Family Medicine

## 2023-04-30 ENCOUNTER — Other Ambulatory Visit: Payer: Self-pay | Admitting: Family Medicine

## 2023-04-30 ENCOUNTER — Encounter: Payer: Self-pay | Admitting: Family Medicine

## 2023-04-30 DIAGNOSIS — N6002 Solitary cyst of left breast: Secondary | ICD-10-CM

## 2023-04-30 DIAGNOSIS — I10 Essential (primary) hypertension: Secondary | ICD-10-CM

## 2023-04-30 DIAGNOSIS — N6001 Solitary cyst of right breast: Secondary | ICD-10-CM

## 2023-04-30 DIAGNOSIS — E669 Obesity, unspecified: Secondary | ICD-10-CM

## 2023-04-30 DIAGNOSIS — F5102 Adjustment insomnia: Secondary | ICD-10-CM

## 2023-04-30 DIAGNOSIS — R928 Other abnormal and inconclusive findings on diagnostic imaging of breast: Secondary | ICD-10-CM | POA: Diagnosis not present

## 2023-04-30 DIAGNOSIS — N6321 Unspecified lump in the left breast, upper outer quadrant: Secondary | ICD-10-CM | POA: Diagnosis not present

## 2023-04-30 DIAGNOSIS — R92323 Mammographic fibroglandular density, bilateral breasts: Secondary | ICD-10-CM | POA: Diagnosis not present

## 2023-04-30 DIAGNOSIS — F419 Anxiety disorder, unspecified: Secondary | ICD-10-CM

## 2023-05-01 DIAGNOSIS — F411 Generalized anxiety disorder: Secondary | ICD-10-CM | POA: Diagnosis not present

## 2023-05-05 DIAGNOSIS — F411 Generalized anxiety disorder: Secondary | ICD-10-CM | POA: Diagnosis not present

## 2023-05-09 ENCOUNTER — Encounter: Payer: Self-pay | Admitting: Dermatology

## 2023-05-09 ENCOUNTER — Ambulatory Visit: Admitting: Dermatology

## 2023-05-09 VITALS — BP 134/97

## 2023-05-09 DIAGNOSIS — L209 Atopic dermatitis, unspecified: Secondary | ICD-10-CM | POA: Diagnosis not present

## 2023-05-09 DIAGNOSIS — F411 Generalized anxiety disorder: Secondary | ICD-10-CM | POA: Diagnosis not present

## 2023-05-09 NOTE — Patient Instructions (Addendum)
 Hello Amazin,  Thank you for visiting today. Here is a summary of the key instructions:  - Medications:   - Use betamethasone cream if the affected area is itchy   - Apply Lipikar (triple-moisture body moisturizer by Michaelle Birks) to help with scaling  - Treatments:   - We will try to get Ebglyss approved for you   - If approved, you will receive training on how to use the auto-injector   - Ebglyss injection schedule:     - First day: 2 injections     - Every 2 weeks for first 16 weeks: 1 injection     - After 16 weeks: 1 injection every 2 months  - Instructions:   - Alternate thighs for injections   - You can apply Vaseline on top of Lipcur for extra moisture  - Follow-up:   - Official follow-up appointment in 4 months   - We will call you to schedule training once Dupixent is approved   Please reach out if you have any questions or concerns.  Warm regards,  Dr. Langston Reusing, Dermatology     Important Information  Due to recent changes in healthcare laws, you may see results of your pathology and/or laboratory studies on MyChart before the doctors have had a chance to review them. We understand that in some cases there may be results that are confusing or concerning to you. Please understand that not all results are received at the same time and often the doctors may need to interpret multiple results in order to provide you with the best plan of care or course of treatment. Therefore, we ask that you please give [place] 2 business days to thoroughly review all your results before contacting the office for clarification. Should we see a critical lab result, you will be contacted sooner.   If You Need Anything After Your Visit  If you have any questions or concerns for your doctor, please call our main line at [phone] If no one answers, please leave a voicemail as directed and we will return your call as soon as possible. Messages left after 4 pm will be answered the  following business day.   You may also send [place] a message via MyChart. We typically respond to MyChart messages within 1-2 business days.  For prescription refills, please ask your pharmacy to contact our office. Our fax number is [phone].  If you have an urgent issue when the clinic is closed that cannot wait until the next business day, you can page your doctor at the number below.    Please note that while we do our best to be available for urgent issues outside of office hours, we are not available 24/7.   If you have an urgent issue and are unable to reach [place], you may choose to seek medical care at your doctor's office, retail clinic, urgent care center, or emergency room.  If you have a medical emergency, please immediately call 911 or go to the emergency department. In the event of inclement weather, please call our main line at [phone] for an update on the status of any delays or closures.  Dermatology Medication Tips: Please keep the boxes that topical medications come in in order to help keep track of the instructions about where and how to use these. Pharmacies typically print the medication instructions only on the boxes and not directly on the medication tubes.   If your medication is too expensive, please contact our office at  [phone] or send [place] a message through MyChart.   We are unable to tell what your co-pay for medications will be in advance as this is different depending on your insurance coverage. However, we may be able to find a substitute medication at lower cost or fill out paperwork to get insurance to cover a needed medication.   If a prior authorization is required to get your medication covered by your insurance company, please allow [place] 1-2 business days to complete this process.  Drug prices often vary depending on where the prescription is filled and some pharmacies may offer cheaper prices.  The website www.goodrx.com contains coupons for  medications through different pharmacies. The prices here do not account for what the cost may be with help from insurance (it may be cheaper with your insurance), but the website can give you the price if you did not use any insurance.  - You can print the associated coupon and take it with your prescription to the pharmacy.  - You may also stop by our office during regular business hours and pick up a GoodRx coupon card.  - If you need your prescription sent electronically to a different pharmacy, notify our office through [facility] or by phone at [phone]

## 2023-05-09 NOTE — Progress Notes (Addendum)
 Follow-Up Visit   Subjective  Destiny Simpson is a 38 y.o. female who presents for the following: Atopic Derm  Patient present today for follow up visit. Patient was last evaluated on 03/28/23. At this visit patient was prescribed Zorvye but was unable to obtain as insurance would not cover so she has been applying Vaseline. Pt has tried and failed [place] & betamethasone . Patient reports sxs are unchanged. Patient denies medication changes.  The following portions of the chart were reviewed this encounter and updated as appropriate: medications, allergies, medical history  Review of Systems:  No other skin or systemic complaints except as noted in HPI or Assessment and Plan.  Objective  Well appearing patient in no apparent distress; mood and affect are within normal limits.  A full examination was performed including scalp, head, eyes, ears, nose, lips, neck, chest, axillae, abdomen, back, buttocks, bilateral upper extremities, bilateral lower extremities, hands, feet, fingers, toes, fingernails, and toenails. All findings within normal limits unless otherwise noted below.    Relevant exam findings are noted in the Assessment and Plan.           Assessment & Plan   ATOPIC DERMATITIS Exam: Scaly pink papules coalescing to plaques 10% BSA 7/10 itch,  IGA 2  flared  Atopic dermatitis (eczema) is a chronic, relapsing, pruritic condition that can significantly affect quality of life. It is often associated with allergic rhinitis and/or asthma and can require treatment with topical medications, phototherapy, or in severe cases biologic injectable medication (Dupixent; Ebglyss, Adbry) or Oral JAK inhibitors.  - Assessment: Patient has been dealing with eczema since infancy. Previous treatments including Cloria Danger (not covered by insurance), Eucrisa (covered but ineffective), and Vaseline (insufficient) have not provided adequate relief. The condition remains irritated with  persistent scaling. More aggressive treatment is necessary.  - Plan:    Initiate betamethasone  for itching, to be applied as needed    Prescribe Lipcur (La Roche-Posay triple-moisture body moisturizer with urea) for exfoliation and hydration    [employer] approval for biologic therapy, preferably Ebglyss    If Ebglyss is not approved, consider Dupixent as an alternative biologic    Biologic therapy regimen: 2 injections on first day, then every 2 weeks for first 16 weeks, followed by every 2 months    Schedule follow-up in 4 months    Upon approval of biologic therapy, schedule patient for injection training with samples      Ebglyss  is a treatment given by injection for adults and children with moderate-to-severe atopic dermatitis. Goal is control of skin condition, not cure. It is given as 2 injections at the first dose followed by 1 injection ever 2 weeks thereafter.  Young children are dosed monthly.  Potential side effects include allergic reaction, herpes infections, injection site reactions and conjunctivitis (inflammation of the eyes).  The use of Ebglyss  requires long term medication management, including periodic office visits.  SCORAD Assessment - Atopic Dermatitis  Extent (A): 10% BSA affected ? Score = 2  Intensity (B):  Erythema: 2  Edema/papulation: 2  Oozing/crust: 0  Excoriation: 1  Lichenification: 1  Dryness: 1 ? Total Intensity Score = 7  Subjective Symptoms (C):  Pruritus: 7/10  Sleep disturbance: Not reported (scored as 0) ? Total Subjective Score = 7  SCORAD Calculation: SCORAD = (A/5) + (7  B/2) + C SCORAD = (2/5) + (7  7/2) + 7 = 0.4 + 24.5 + 7 = 31.9  Interpretation: Moderate atopic dermatitis (SCORAD =  31.9)  Severity Scoring (from clinical description): Erythema: 2 (pink plaques)  Induration/papulation: 2  Excoriation: 1 (due to itch)  Lichenification: 1  ? Total severity score per region = 2 + 2 + 1 + 1 = EASI Score  6   No follow-ups on file.    Documentation: I have reviewed the above documentation for accuracy and completeness, and I agree with the above.   I, Shirron Louanne Roussel, CMA, am acting as scribe for [employer], DO.   Louana Roup, DO

## 2023-05-12 DIAGNOSIS — F320 Major depressive disorder, single episode, mild: Secondary | ICD-10-CM | POA: Diagnosis not present

## 2023-05-12 DIAGNOSIS — F411 Generalized anxiety disorder: Secondary | ICD-10-CM | POA: Diagnosis not present

## 2023-05-18 ENCOUNTER — Other Ambulatory Visit: Payer: Self-pay | Admitting: Family Medicine

## 2023-05-18 DIAGNOSIS — F5102 Adjustment insomnia: Secondary | ICD-10-CM

## 2023-05-20 NOTE — Telephone Encounter (Signed)
 Requested Prescriptions  Pending Prescriptions Disp Refills   traZODone (DESYREL) 50 MG tablet [Pharmacy Med Name: TRAZODONE 50MG  TABLETS] 30 tablet 0    Sig: TAKE 1/2 TO 1 TABLET(25 TO 50 MG) BY MOUTH AT BEDTIME AS NEEDED FOR SLEEP     Psychiatry: Antidepressants - Serotonin Modulator Passed - 05/20/2023  3:16 PM      Passed - Valid encounter within last 6 months    Recent Outpatient Visits           1 month ago Cyst of right breast   [facility] Simmons-Robinson, [place], [place]   10 years ago UTI (urinary tract infection)   [facility] at [facility], [place], [place]   10 years ago Neck pain   [facility] at [facility], [place]   11 years ago UTI (urinary tract infection)   [facility] at [facility], [place], PA-C   11 years ago Urticaria   [facility] at [facility], [place], MD       Future Appointments             In 3 weeks Padgett, Pilar Grammes, MD [facility] [place]   In 1 month Simmons-Robinson, Tawanna Cooler, MD [facility]

## 2023-05-23 ENCOUNTER — Other Ambulatory Visit: Payer: Self-pay | Admitting: Family Medicine

## 2023-05-23 ENCOUNTER — Ambulatory Visit: Payer: Federal, State, Local not specified - PPO | Admitting: Family Medicine

## 2023-05-23 DIAGNOSIS — E669 Obesity, unspecified: Secondary | ICD-10-CM

## 2023-05-23 DIAGNOSIS — F419 Anxiety disorder, unspecified: Secondary | ICD-10-CM

## 2023-05-23 DIAGNOSIS — F5102 Adjustment insomnia: Secondary | ICD-10-CM

## 2023-06-12 ENCOUNTER — Ambulatory Visit: Payer: Federal, State, Local not specified - PPO | Admitting: Allergy

## 2023-06-18 ENCOUNTER — Telehealth: Payer: Self-pay

## 2023-06-18 DIAGNOSIS — L209 Atopic dermatitis, unspecified: Secondary | ICD-10-CM

## 2023-06-18 MED ORDER — DUPIXENT 300 MG/2ML ~~LOC~~ SOAJ: 600.0000 mg | Freq: Once | SUBCUTANEOUS | 0 refills | Status: AC

## 2023-06-18 MED ORDER — DUPIXENT 300 MG/2ML ~~LOC~~ SOAJ: 300.0000 mg | SUBCUTANEOUS | 11 refills | Status: DC

## 2023-06-18 NOTE — Addendum Note (Signed)
 Addended byCharlann Confer on: 06/18/2023 09:24 AM   Modules accepted: Orders

## 2023-06-18 NOTE — Telephone Encounter (Signed)
 I spoke with Ms. Destiny Simpson and advised her insurance denied the PA for [employer]. I advised her insurance will cover Dupixent and no additional appt is needed. I advised she will need to complete the enrollment forms to get patient assistance. Forms were scanned to her email and advised once completed to fax our office the completed forms so I can attach to her prescription. Patient voiced understanding.

## 2023-06-27 ENCOUNTER — Ambulatory Visit: Admitting: Family Medicine

## 2023-07-02 ENCOUNTER — Encounter: Payer: Self-pay | Admitting: Family Medicine

## 2023-07-02 ENCOUNTER — Ambulatory Visit: Admitting: Family Medicine

## 2023-07-02 VITALS — BP 135/60 | HR 84 | Ht 61.0 in | Wt 173.0 lb

## 2023-07-02 DIAGNOSIS — M545 Low back pain, unspecified: Secondary | ICD-10-CM

## 2023-07-02 DIAGNOSIS — F419 Anxiety disorder, unspecified: Secondary | ICD-10-CM

## 2023-07-02 DIAGNOSIS — F5102 Adjustment insomnia: Secondary | ICD-10-CM | POA: Diagnosis not present

## 2023-07-02 DIAGNOSIS — E669 Obesity, unspecified: Secondary | ICD-10-CM

## 2023-07-02 MED ORDER — NALTREXONE HCL 50 MG PO TABS: 50.0000 mg | ORAL_TABLET | Freq: Two times a day (BID) | ORAL | 3 refills | Status: AC

## 2023-07-02 MED ORDER — BUSPIRONE HCL 7.5 MG PO TABS: 7.5000 mg | ORAL_TABLET | Freq: Two times a day (BID) | ORAL | 2 refills | Status: AC

## 2023-07-02 MED ORDER — GABAPENTIN 100 MG PO CAPS: 100.0000 mg | ORAL_CAPSULE | Freq: Every day | ORAL | 2 refills | Status: AC

## 2023-07-02 MED ORDER — BUPROPION HCL ER (SR) 100 MG PO TB12: 200.0000 mg | ORAL_TABLET | Freq: Two times a day (BID) | ORAL | 3 refills | Status: AC

## 2023-07-02 NOTE — Progress Notes (Signed)
 Established patient visit   Patient: Destiny Simpson   DOB: [birth year]   37 y.o. Female  MRN: [id number] Visit Date: 07/02/2023  Today's healthcare provider: Mimi Alt, MD   Chief Complaint  Patient presents with   Weight Loss   Subjective       Discussed the use of AI scribe software for clinical note transcription with the patient, who gave verbal consent to proceed.  History of Present Illness Destiny Simpson is a 38 year old female who presents for weight management.  She has experienced a weight increase from 169 to 173 pounds since her last visit. She was prescribed Wellbutrin  for weight management, but insurance did not cover Contrave  or GLP-1 medications. She has been taking Wellbutrin  and naltrexone  but paused due to increased appetite and stress eating. When on the medication, she had no appetite.  She describes significant stress related to her job situation, impacting her mental health and sleep. She feels mentally drained and experiences insomnia despite taking sleep medications. She mentions night sweats, feeling not depressed but just in a mood, and a lack of energy. She has been eating more due to stress and feels not herself.  She reports ongoing night sweats, which she attributes to stress or hormonal changes. She mentions a family history of early menopause on her father's side. She is currently taking Drysol for hormonal management and has a history of endometriosis, which affects her menstrual cycle.  She has been prescribed trazodone  for sleep, which initially helped but has been less effective recently due to increased stress levels. She also takes Singulair  at night. She uses Valium vaginally for muscle relaxation during long drives.  She has not been able to attend line dancing classes, which she previously enjoyed, due to her work schedule. She is currently in therapy, which she finds helpful, but notes that the benefits are short-lived.  She wants to return to her previous level of activity and mental well-being.  Her son, aged 40, is a source of stress due to car issues, and she has decided not to invest more money into his car, leaving it to his father to handle.     Past Medical History:  Diagnosis Date   Abnormal Pap smear    Allergy     Angio-edema    Anxiety    Asthma    Eczema    Hypertension 09/21/2021   Ovarian cyst    Urticaria     Medications: Outpatient Medications Prior to Visit  Medication Sig   Azelaic Acid (FINACEA) 15 % cream Apply 1 application  topically every morning. After skin is thoroughly washed and patted dry, gently but thoroughly massage a thin film of azelaic acid cream into the affected area twice daily, in the morning and evening. (Patient not taking: Reported on 07/02/2023)   betamethasone  dipropionate 0.05 % cream Apply twice a day for 3 weeks and then stop and take a break for 2 weeks.  Repeat until clear.   clobetasol  ointment (TEMOVATE ) 0.05 % Apply 1 Application topically 2 (two) times daily as needed.   diazepam (VALIUM) 5 MG tablet Take 5 mg by mouth at bedtime.   Dupilumab (DUPIXENT) 300 MG/2ML SOAJ Inject 300 mg into the skin every 14 (fourteen) days. Starting at day 15 for maintenance.   ELIDEL  1 % cream Apply topically 2 (two) times daily as needed. (Patient not taking: Reported on 07/02/2023)   EPINEPHrine  (AUVI-Q ) 0.3 mg/0.3 mL IJ SOAJ injection Inject 0.3 mg  into the muscle as needed for anaphylaxis.   JUNEL  FE 1.5/30 1.5-30 MG-MCG tablet TAKE 1 TABLET BY MOUTH EVERY DAY   levocetirizine (XYZAL ) 5 MG tablet Take 1 tablet (5 mg total) by mouth every evening.   meloxicam  (MOBIC ) 15 MG tablet Take 1 tablet (15 mg total) by mouth daily.   montelukast  (SINGULAIR ) 10 MG tablet Take 1 tablet (10 mg total) by mouth at bedtime.   PROTOPIC  0.1 % ointment Apply topically 2 (two) times daily. (Patient not taking: Reported on 07/02/2023)   tiZANidine  (ZANAFLEX ) 4 MG tablet Take 1 tablet  (4 mg total) by mouth every 6 (six) hours as needed for muscle spasms.   traZODone  (DESYREL ) 50 MG tablet TAKE 1/2 TO 1 TABLET(25 TO 50 MG) BY MOUTH AT BEDTIME AS NEEDED FOR SLEEP   triamcinolone  cream (KENALOG ) 0.1 % Apply 1 Application topically 2 (two) times daily as needed.   [DISCONTINUED] buPROPion  ER (WELLBUTRIN  SR) 100 MG 12 hr tablet Take 2 tablets (200 mg total) by mouth 2 (two) times daily.   [DISCONTINUED] gabapentin  (NEURONTIN ) 100 MG capsule Take 1 capsule (100 mg total) by mouth at bedtime.   No facility-administered medications prior to visit.    Review of Systems  Last metabolic panel Lab Results  Component Value Date   GLUCOSE 87 10/01/2022   NA 138 10/01/2022   K 4.4 10/01/2022   CL 104 10/01/2022   CO2 21 10/01/2022   BUN 11 10/01/2022   CREATININE 0.88 10/01/2022   EGFR 87 10/01/2022   CALCIUM 9.0 10/01/2022   PROT 6.5 10/01/2022   ALBUMIN 3.9 10/01/2022   LABGLOB 2.6 10/01/2022   BILITOT 0.4 10/01/2022   ALKPHOS 72 10/01/2022   AST 14 10/01/2022   ALT 14 10/01/2022   Last hemoglobin A1c Lab Results  Component Value Date   HGBA1C 5.5 10/01/2022   Last thyroid  functions Lab Results  Component Value Date   TSH 1.750 10/01/2022   Last vitamin D  Lab Results  Component Value Date   VD25OH 55.9 07/30/2022   Last vitamin B12 and Folate Lab Results  Component Value Date   VITAMINB12 1,371 (H) 07/30/2022        Objective    BP 135/60   Pulse 84   Ht 5\' 1"  (1.549 m)   Wt 173 lb (78.5 kg)   SpO2 100%   BMI 32.69 kg/m  BP Readings from Last 3 Encounters:  07/02/23 135/60  05/09/23 (!) 134/97  04/11/23 133/84   Wt Readings from Last 3 Encounters:  07/02/23 173 lb (78.5 kg)  04/11/23 169 lb (76.7 kg)  10/01/22 165 lb (74.8 kg)        Physical Exam Vitals reviewed.  Constitutional:      General: She is not in acute distress.    Appearance: Normal appearance. She is not ill-appearing.  Cardiovascular:     Rate and Rhythm: Normal  rate and regular rhythm.  Pulmonary:     Effort: Pulmonary effort is normal. No respiratory distress.     Breath sounds: No wheezing, rhonchi or rales.  Neurological:     Mental Status: She is alert and oriented to person, place, and time.  Psychiatric:        Mood and Affect: Mood normal.        Behavior: Behavior normal.     Physical Exam MEASUREMENTS: Weight- 173, BMI- 32.69.    No results found for any visits on 07/02/23.  Assessment & Plan     Problem List  Items Addressed This Visit       Other   Obesity (BMI 30-39.9)   Obesity,Chronic BMI is 32.69, with weight increasing from 169 to 173 pounds. Stress and emotional eating are contributing factors. Previous attempts with Wellbutrin  and naltrexone  were paused due to stress-related eating. Insurance did not cover GLP-1 agonists or Contrave . - Refill naltrexone  50 mg twice a day. - Continue Wellbutrin  100 mg twice a day. - Encourage physical activity, such as walking for 15-20 minutes daily. - Advise moderation in ice cream consumption.      Relevant Medications   naltrexone  (DEPADE) 50 MG tablet   buPROPion  ER (WELLBUTRIN  SR) 100 MG 12 hr tablet   Anxiety - Primary   Anxiety Chronic, worsening  High anxiety levels contribute to stress and emotional eating. Current stressors include job instability and personal life challenges. Therapy is ongoing and helpful but not sufficient. Buspirone is considered for anxiety management, with a caution against alcohol consumption. - Prescribe buspirone 7.5 mg twice a day for anxiety management. - Advise against alcohol consumption while on buspirone.      Relevant Medications   buPROPion  ER (WELLBUTRIN  SR) 100 MG 12 hr tablet   busPIRone (BUSPAR) 7.5 MG tablet   Adjustment insomnia   Insomnia Chronic, worsened She reports difficulty sleeping despite taking trazodone . Stress and anxiety are likely contributing factors. Trazodone  was previously effective but has become less so  due to increased stress levels. Lunesta was discussed as an alternative, but she prefers to avoid it due to dependency concerns. - Consider increasing trazodone  dosage to 150-200mg  qhs - Prescribe gabapentin  100 mg at bedtime to aid sleep and address potential perimenopausal symptoms.      Relevant Medications   gabapentin  (NEURONTIN ) 100 MG capsule   Other Visit Diagnoses       Acute bilateral low back pain without sciatica       Relevant Medications   gabapentin  (NEURONTIN ) 100 MG capsule       Assessment & Plan    Depression She feels mentally drained and not like herself, though she does not identify as depressed. Stress and anxiety from work and personal life are significant contributors. Wellbutrin  was previously effective in managing symptoms. - Continue Wellbutrin  100 mg twice a day. - Encourage continuation of therapy sessions.   Night sweats She experiences night sweats, possibly related to stress or hormonal changes. Previous thyroid  function was normal. Gynecological consultation may be beneficial for hormonal evaluation. Gabapentin  is considered to address night sweats and potential perimenopausal symptoms. - Prescribe gabapentin  100 mg at bedtime to address night sweats and potential perimenopausal symptoms.     Return in about 1 month (around 08/02/2023) for Anxiety, Insomnia.         Mimi Alt, MD  [facility] [phone] (phone) [phone] (fax)  [facility]

## 2023-07-03 NOTE — Assessment & Plan Note (Signed)
 Insomnia Chronic, worsened She reports difficulty sleeping despite taking trazodone . Stress and anxiety are likely contributing factors. Trazodone  was previously effective but has become less so due to increased stress levels. Lunesta was discussed as an alternative, but she prefers to avoid it due to dependency concerns. - Consider increasing trazodone  dosage to 150-200mg  qhs - Prescribe gabapentin  100 mg at bedtime to aid sleep and address potential perimenopausal symptoms.

## 2023-07-03 NOTE — Assessment & Plan Note (Signed)
 Anxiety Chronic, worsening  High anxiety levels contribute to stress and emotional eating. Current stressors include job instability and personal life challenges. Therapy is ongoing and helpful but not sufficient. Buspirone is considered for anxiety management, with a caution against alcohol consumption. - Prescribe buspirone 7.5 mg twice a day for anxiety management. - Advise against alcohol consumption while on buspirone.

## 2023-07-03 NOTE — Assessment & Plan Note (Signed)
 Obesity,Chronic BMI is 32.69, with weight increasing from 169 to 173 pounds. Stress and emotional eating are contributing factors. Previous attempts with Wellbutrin  and naltrexone  were paused due to stress-related eating. Insurance did not cover GLP-1 agonists or Contrave . - Refill naltrexone  50 mg twice a day. - Continue Wellbutrin  100 mg twice a day. - Encourage physical activity, such as walking for 15-20 minutes daily. - Advise moderation in ice cream consumption.

## 2023-07-04 ENCOUNTER — Ambulatory Visit: Admitting: Family Medicine

## 2023-07-24 DIAGNOSIS — F411 Generalized anxiety disorder: Secondary | ICD-10-CM | POA: Diagnosis not present

## 2023-07-30 ENCOUNTER — Encounter: Payer: Self-pay | Admitting: Family Medicine

## 2023-07-30 ENCOUNTER — Telehealth (INDEPENDENT_AMBULATORY_CARE_PROVIDER_SITE_OTHER): Admitting: Family Medicine

## 2023-07-30 DIAGNOSIS — F419 Anxiety disorder, unspecified: Secondary | ICD-10-CM | POA: Diagnosis not present

## 2023-07-30 DIAGNOSIS — E669 Obesity, unspecified: Secondary | ICD-10-CM

## 2023-07-30 DIAGNOSIS — F5102 Adjustment insomnia: Secondary | ICD-10-CM

## 2023-07-30 DIAGNOSIS — M545 Low back pain, unspecified: Secondary | ICD-10-CM

## 2023-07-30 MED ORDER — ALPRAZOLAM 0.25 MG PO TABS: 0.2500 mg | ORAL_TABLET | Freq: Two times a day (BID) | ORAL | 0 refills | Status: AC | PRN

## 2023-07-30 NOTE — Assessment & Plan Note (Signed)
 Anxiety with Panic Attack She experienced her first panic attack on June 3rd, severe enough to require calling 911. The panic attack was likely triggered by stress related to work and personal life, including the passing of her aunt. She reports feeling overwhelmed and stressed, particularly with work. Currently on Wellbutrin , naltrexone , and Buspar  for anxiety management. Alprazolam was discussed as a rescue medication for acute panic symptoms, emphasizing its use in emergencies and not to ignore potential cardiac symptoms. The decision to prescribe alprazolam was made to provide a quick-acting option to interrupt panic symptoms if she occurs again. - Continue Buspar  7.5 mg twice daily for anxiety. - Prescribe alprazolam 0.25 mg as needed for acute panic attacks. - Continue Wellbutrin  and naltrexone  for anxiety and weight management.

## 2023-07-30 NOTE — Progress Notes (Signed)
 MyChart Video Visit    Virtual Visit via Video Note   This format is felt to be most appropriate for this patient at this time. Physical exam was limited by quality of the video and audio technology used for the visit.   Patient location: Patient's home address   Provider location: [facility]  [address]  [place], [place] [address]   I discussed the limitations of evaluation and management by telemedicine and the availability of in person appointments. The patient expressed understanding and agreed to proceed.  Patient: Destiny Simpson   DOB: 1986/01/08   38 y.o. Female  MRN: [id number] Visit Date: 07/30/2023  Today's healthcare provider: Mimi Alt, MD   Chief Complaint  Patient presents with   Anxiety   Panic Attack   Subjective    HPI   Discussed the use of AI scribe software for clinical note transcription with the patient, who gave verbal consent to proceed.  History of Present Illness Destiny Simpson is a 38 year old female with anxiety and obesity who presents for follow-up on anxiety treatment and weight loss.  She experienced her first panic attack on June 3rd, which was severe enough to require a 911 call. The panic attack felt like a heart attack and occurred while she was sitting down, feeling overwhelmed due to work-related stress and personal circumstances, including her aunt being in hospice. Since then, she has been actively seeking new job opportunities on [employer], as she feels her current job is affecting her health.  She is currently taking Wellbutrin  100 mg twice daily and naltrexone  50 mg daily for anxiety and weight management. Recently, she was started on Buspar  7.5 mg twice daily for anxiety and gabapentin  for insomnia and chronic intermittent musculoskeletal pain. Her sleep has improved, and the gabapentin  has helped with her musculoskeletal pain, especially after long drives. She continues to experience  night sweats, which she attributes to stress about the following day.  She is in weekly therapy sessions, with her next appointment scheduled for the day after this visit. She feels that the Buspar  has been beneficial for her daily anxiety management. She has not checked her weight recently but recalls her last blood pressure check was two weeks ago.  No current chest tightness or difficulty breathing. Her mood is 'okay', and she does not report any new symptoms since the panic attack.      Past Medical History:  Diagnosis Date   Abnormal Pap smear    Allergy     Angio-edema    Anxiety    Asthma    Eczema    Hypertension 09/21/2021   Ovarian cyst    Urticaria     Medications: Outpatient Medications Prior to Visit  Medication Sig   Azelaic Acid (FINACEA) 15 % cream Apply 1 application  topically every morning. After skin is thoroughly washed and patted dry, gently but thoroughly massage a thin film of azelaic acid cream into the affected area twice daily, in the morning and evening. (Patient not taking: Reported on 07/02/2023)   betamethasone  dipropionate 0.05 % cream Apply twice a day for 3 weeks and then stop and take a break for 2 weeks.  Repeat until clear.   buPROPion  ER (WELLBUTRIN  SR) 100 MG 12 hr tablet Take 2 tablets (200 mg total) by mouth 2 (two) times daily.   busPIRone  (BUSPAR ) 7.5 MG tablet Take 1 tablet (7.5 mg total) by mouth 2 (two) times daily.   clobetasol  ointment (  TEMOVATE ) 0.05 % Apply 1 Application topically 2 (two) times daily as needed.   diazepam (VALIUM) 5 MG tablet Take 5 mg by mouth at bedtime.   Dupilumab  (DUPIXENT ) 300 MG/2ML SOAJ Inject 300 mg into the skin every 14 (fourteen) days. Starting at day 15 for maintenance.   ELIDEL  1 % cream Apply topically 2 (two) times daily as needed. (Patient not taking: Reported on 07/02/2023)   EPINEPHrine  (AUVI-Q ) 0.3 mg/0.3 mL IJ SOAJ injection Inject 0.3 mg into the muscle as needed for anaphylaxis.   gabapentin   (NEURONTIN ) 100 MG capsule Take 1 capsule (100 mg total) by mouth at bedtime.   JUNEL  FE 1.5/30 1.5-30 MG-MCG tablet TAKE 1 TABLET BY MOUTH EVERY DAY   levocetirizine (XYZAL ) 5 MG tablet Take 1 tablet (5 mg total) by mouth every evening.   meloxicam  (MOBIC ) 15 MG tablet Take 1 tablet (15 mg total) by mouth daily.   montelukast  (SINGULAIR ) 10 MG tablet Take 1 tablet (10 mg total) by mouth at bedtime.   naltrexone  (DEPADE) 50 MG tablet Take 1 tablet (50 mg total) by mouth in the morning and at bedtime.   PROTOPIC  0.1 % ointment Apply topically 2 (two) times daily. (Patient not taking: Reported on 07/02/2023)   tiZANidine  (ZANAFLEX ) 4 MG tablet Take 1 tablet (4 mg total) by mouth every 6 (six) hours as needed for muscle spasms.   traZODone  (DESYREL ) 50 MG tablet TAKE 1/2 TO 1 TABLET(25 TO 50 MG) BY MOUTH AT BEDTIME AS NEEDED FOR SLEEP   triamcinolone  cream (KENALOG ) 0.1 % Apply 1 Application topically 2 (two) times daily as needed.   No facility-administered medications prior to visit.    Review of Systems  Last metabolic panel Lab Results  Component Value Date   GLUCOSE 87 10/01/2022   NA 138 10/01/2022   K 4.4 10/01/2022   CL 104 10/01/2022   CO2 21 10/01/2022   BUN 11 10/01/2022   CREATININE 0.88 10/01/2022   EGFR 87 10/01/2022   CALCIUM 9.0 10/01/2022   PROT 6.5 10/01/2022   ALBUMIN 3.9 10/01/2022   LABGLOB 2.6 10/01/2022   BILITOT 0.4 10/01/2022   ALKPHOS 72 10/01/2022   AST 14 10/01/2022   ALT 14 10/01/2022   Last lipids Lab Results  Component Value Date   CHOL 158 10/01/2022   HDL 47 10/01/2022   LDLCALC 97 10/01/2022   TRIG 69 10/01/2022   Last hemoglobin A1c Lab Results  Component Value Date   HGBA1C 5.5 10/01/2022   Last thyroid  functions Lab Results  Component Value Date   TSH 1.750 10/01/2022        Objective    There were no vitals taken for this visit.  BP Readings from Last 3 Encounters:  07/02/23 135/60  05/09/23 (!) 134/97  04/11/23 133/84    Wt Readings from Last 3 Encounters:  07/02/23 173 lb (78.5 kg)  04/11/23 169 lb (76.7 kg)  10/01/22 165 lb (74.8 kg)        Physical Exam Vitals reviewed.  Constitutional:      General: She is not in acute distress.    Appearance: Normal appearance. She is not ill-appearing.  Pulmonary:     Effort: Pulmonary effort is normal. No respiratory distress.   Neurological:     Mental Status: She is alert and oriented to person, place, and time.   Psychiatric:        Mood and Affect: Mood normal.        Behavior: Behavior normal.  Thought Content: Thought content normal.     Physical Exam MEASUREMENTS: Weight- 173.     Assessment & Plan     Problem List Items Addressed This Visit       Other   Obesity (BMI 30-39.9)   She is on Wellbutrin  and naltrexone  for weight management. Reports feeling good about her weight management efforts. The last recorded weight was 173 pounds. Bmi 37 from last weight measurement - Continue Wellbutrin  100mg  BID and naltrexone  50mg  BID for weight management.      Anxiety - Primary   Anxiety with Panic Attack She experienced her first panic attack on June 3rd, severe enough to require calling 911. The panic attack was likely triggered by stress related to work and personal life, including the passing of her aunt. She reports feeling overwhelmed and stressed, particularly with work. Currently on Wellbutrin , naltrexone , and Buspar  for anxiety management. Alprazolam was discussed as a rescue medication for acute panic symptoms, emphasizing its use in emergencies and not to ignore potential cardiac symptoms. The decision to prescribe alprazolam was made to provide a quick-acting option to interrupt panic symptoms if she occurs again. - Continue Buspar  7.5 mg twice daily for anxiety. - Prescribe alprazolam 0.25 mg as needed for acute panic attacks. - Continue Wellbutrin  and naltrexone  for anxiety and weight management.      Relevant  Medications   ALPRAZolam (XANAX) 0.25 MG tablet   Adjustment insomnia   Other Visit Diagnoses       Acute bilateral low back pain without sciatica            Assessment & Plan   Insomnia Her sleep has improved with the use of gabapentin . She does not report current issues with insomnia, although she experiences night sweats, which she attributes to stress about the following day. - Continue gabapentin  for sleep and musculoskeletal pain management.  Chronic Intermittent Musculoskeletal Pain Reports improvement in musculoskeletal pain with gabapentin , especially after long drives. The pain is chronic and intermittent. - Continue gabapentin  for musculoskeletal pain management.       Return in about 3 months (around 10/30/2023) for Weight MGMT, Anxiety.     I discussed the assessment and treatment plan with the patient. The patient was provided an opportunity to ask questions and all were answered. The patient agreed with the plan and demonstrated an understanding of the instructions.   The patient was advised to call back or seek an in-person evaluation if the symptoms worsen or if the condition fails to improve as anticipated.  I provided 25 minutes of non-face-to-face time during this encounter.   Mimi Alt, MD [facility] [phone] (phone) [phone] (fax)  [facility]

## 2023-07-30 NOTE — Assessment & Plan Note (Signed)
 She is on Wellbutrin  and naltrexone  for weight management. Reports feeling good about her weight management efforts. The last recorded weight was 173 pounds. Bmi 37 from last weight measurement - Continue Wellbutrin  100mg  BID and naltrexone  50mg  BID for weight management.

## 2023-07-31 DIAGNOSIS — F411 Generalized anxiety disorder: Secondary | ICD-10-CM | POA: Diagnosis not present

## 2023-08-06 ENCOUNTER — Ambulatory Visit: Payer: Federal, State, Local not specified - PPO | Admitting: Dermatology

## 2023-08-06 ENCOUNTER — Other Ambulatory Visit: Payer: Self-pay | Admitting: Dermatology

## 2023-08-06 DIAGNOSIS — L209 Atopic dermatitis, unspecified: Secondary | ICD-10-CM

## 2023-08-08 ENCOUNTER — Ambulatory Visit: Admitting: Dermatology

## 2023-08-13 DIAGNOSIS — F411 Generalized anxiety disorder: Secondary | ICD-10-CM | POA: Diagnosis not present

## 2023-08-22 DIAGNOSIS — F411 Generalized anxiety disorder: Secondary | ICD-10-CM | POA: Diagnosis not present

## 2023-09-02 ENCOUNTER — Encounter: Payer: Self-pay | Admitting: Dermatology

## 2023-09-02 ENCOUNTER — Ambulatory Visit: Admitting: Dermatology

## 2023-09-02 DIAGNOSIS — L209 Atopic dermatitis, unspecified: Secondary | ICD-10-CM

## 2023-09-02 MED ORDER — DUPILUMAB 300 MG/2ML ~~LOC~~ SOAJ
600.0000 mg | Freq: Once | SUBCUTANEOUS | Status: AC
  Administered 2023-09-02: 600 mg via SUBCUTANEOUS

## 2023-09-02 NOTE — Progress Notes (Signed)
   Follow-Up Visit   Subjective  Destiny Simpson is a 38 y.o. female who presents for the following: Dupixent  Injection Teaching  Patient present today for follow up visit for Dupixent  Injection Teaching. Patient was last evaluated on 05/09/2023. At this visit patient was prescribed Dupixent . Patient reports sxs are unchanged. Patient denies medication changes.  The following portions of the chart were reviewed this encounter and updated as appropriate: medications, allergies, medical history  Review of Systems:  No other skin or systemic complaints except as noted in HPI or Assessment and Plan.  Objective  Well appearing patient in no apparent distress; mood and affect are within normal limits.  A focused examination was performed of the following areas: Scattered throughout the body  Relevant exam findings are noted in the Assessment and Plan.    Assessment & Plan   ATOPIC DERMATITIS Exam: Scaly pink papules coalescing to plaques 10% BSA  Not at goal  Atopic dermatitis (eczema) is a chronic, relapsing, pruritic condition that can significantly affect quality of life. It is often associated with allergic rhinitis and/or asthma and can require treatment with topical medications, phototherapy, or in severe cases biologic injectable medication (Dupixent ; Adbry) or Oral JAK inhibitors.  Treatment Plan: - Patient instructed how to complete Dupixent  Injections with Demonstration Pen - Patient completed Injections while in office at [facility] Abdomen, Mild erythema noted at injection site.   - Advised to continue topicals for break through Eczema Flares - Patient tolerated well and demonstrated understanding - Recommend gentle skin care - Plan to follow up in 4 months   DUPIXENT  (Patient supplied ) NDC: 9975-4084-79 LOT: [id number] EXP: 12/12/2024   ATOPIC DERMATITIS, UNSPECIFIED TYPE   Related Medications DUPIXENT  300 MG/2ML SOAJ INJECT 2 PENS UNDER THE SKIN ON DAY 1, THEN INJECT 1  PEN ON DAY 15, THEN 1 PEN EVERY 14 DAYS THEREAFTER Dupilumab  SOAJ 600 mg   Return in about 4 months (around 01/03/2024) for Eczema F/U.  I, Jetta Ager, am acting as Neurosurgeon for [employer], DO.  Documentation: I have reviewed the above documentation for accuracy and completeness, and I agree with the above.  Delon Lenis, DO

## 2023-09-02 NOTE — Patient Instructions (Signed)

## 2023-09-03 DIAGNOSIS — F411 Generalized anxiety disorder: Secondary | ICD-10-CM | POA: Diagnosis not present

## 2023-09-26 ENCOUNTER — Ambulatory Visit: Admitting: Allergy

## 2023-09-30 ENCOUNTER — Encounter: Payer: Self-pay | Admitting: Family Medicine

## 2023-09-30 ENCOUNTER — Other Ambulatory Visit: Payer: Self-pay

## 2023-09-30 DIAGNOSIS — M545 Low back pain, unspecified: Secondary | ICD-10-CM

## 2023-09-30 MED ORDER — TIZANIDINE HCL 4 MG PO TABS: 4.0000 mg | ORAL_TABLET | Freq: Four times a day (QID) | ORAL | 2 refills | Status: AC | PRN

## 2023-09-30 NOTE — Telephone Encounter (Signed)
 LOV 07/30/23 NOV none LRF 01/22/23 30 x 0  Copied from mychart message  Good morning Dr. Sharma,   I hope all is well. I am I just got back from [place], [place] after being there two full weeks. 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 I had limited service & was unable to request for a refill of the Tizanidine . Is there anyway you can please refill the Tizanidine  muscle relaxer? In December, I had a similar issue with office chairs at the bank.  The office chairs are very hard and stiff. I've taken ibuprofen  and used heat pads, still severe.     Thank you & have a great day

## 2023-10-02 DIAGNOSIS — F411 Generalized anxiety disorder: Secondary | ICD-10-CM | POA: Diagnosis not present

## 2023-10-09 ENCOUNTER — Encounter: Payer: Self-pay | Admitting: Allergy

## 2023-10-09 ENCOUNTER — Ambulatory Visit: Admitting: Allergy

## 2023-10-09 VITALS — BP 128/82 | HR 89 | Ht 61.0 in | Wt 176.0 lb

## 2023-10-09 DIAGNOSIS — L308 Other specified dermatitis: Secondary | ICD-10-CM

## 2023-10-09 DIAGNOSIS — T781XXD Other adverse food reactions, not elsewhere classified, subsequent encounter: Secondary | ICD-10-CM

## 2023-10-09 DIAGNOSIS — J3089 Other allergic rhinitis: Secondary | ICD-10-CM

## 2023-10-09 DIAGNOSIS — H1013 Acute atopic conjunctivitis, bilateral: Secondary | ICD-10-CM

## 2023-10-09 DIAGNOSIS — J302 Other seasonal allergic rhinitis: Secondary | ICD-10-CM

## 2023-10-09 MED ORDER — MONTELUKAST SODIUM 10 MG PO TABS: 10.0000 mg | ORAL_TABLET | Freq: Every day | ORAL | 5 refills | Status: AC

## 2023-10-09 MED ORDER — TRIAMCINOLONE ACETONIDE 0.1 % EX CREA: 1.0000 | TOPICAL_CREAM | Freq: Two times a day (BID) | CUTANEOUS | 5 refills | Status: AC | PRN

## 2023-10-09 MED ORDER — CLOBETASOL PROPIONATE 0.05 % EX OINT
1.0000 | TOPICAL_OINTMENT | Freq: Two times a day (BID) | CUTANEOUS | 5 refills | Status: AC | PRN
Start: 2023-10-09 — End: ?

## 2023-10-09 MED ORDER — LEVOCETIRIZINE DIHYDROCHLORIDE 5 MG PO TABS: 5.0000 mg | ORAL_TABLET | Freq: Every evening | ORAL | 5 refills | Status: AC

## 2023-10-09 NOTE — Patient Instructions (Addendum)
-   Continue avoidance measures for grasses, ragweed, weeds, trees, cat, and cockroach - Continue with:  Xyzal  (levocetirizine) 5mg  tablet once daily Singulair  (montelukast ) 10mg  daily at bedtime. Continue steam treatments to help with nasal symptoms  Pataday  (olopatadine ) one drop per eye daily as needed for itchy/watery eyes.  - You can use an extra dose of the antihistamine, if needed, for breakthrough symptoms.  - Consider allergy  shots as a means of long-term control if medication management is not effective enough.  Allergy  shots re-train and reset the immune system to ignore environmental allergens and decrease the resulting immune response to those allergens (sneezing, itchy watery eyes, runny nose, nasal congestion, etc).  Allergy  shots improve symptoms in 75-85% of patients. If interested in this therapy let me know.   - Bathe and soak for 5-10 minutes in warm water once a day. Pat dry.  Immediately apply the below cream prescribed to flared areas (red, irritated, dry, itchy, patchy, scaly, flaky) only. Wait several minutes and then apply your moisturizer all over.   - Continue eczema therapy recommendations from Dr. Alm, your dermatologist - Continue Dupixent  injections every 2 weeks and rotate your thighs for the injections.   Reviewed and mechanism of action, protocol and side effects today with Dupixent   - Food skin testing was positive to shrimp and shellfish mix.  Negative to milk and fish.    Milk is likely more of an intolerance.  - Continue avoidance of shellfish. - Recommend you have access to self-injectable epinephrine  (Epipen  or AuviQ) 0.3mg  in case of allergic reaction - Follow emergency action plan in case of allergic reaction   Allergy : food allergy  is when you have eaten a food, developed an allergic reaction after eating the food and have IgE to the food (positive food testing either by skin testing or blood testing).  Food allergy  could lead to life threatening  symptoms  Sensitivity: occurs when you have IgE to a food (positive food testing either by skin testing or blood testing) but is a food you eat without any issues.  This is not an allergy  and we recommend keeping the food in the diet  Intolerance: this is when you have negative testing by either skin testing or blood testing thus not allergic but the food causes symptoms (like belly pain, bloating, diarrhea etc) with ingestion.  These foods should be avoided to prevent symptoms.     Follow-up in 6 months or sooner if needed

## 2023-10-09 NOTE — Progress Notes (Signed)
 Follow-up Note  RE: Destiny Simpson MRN: [id number] DOB: December 31, 1985 Date of Office Visit: 10/09/2023   History of present illness: Destiny Simpson is a 38 y.o. female presenting today for follow-up of allergic rhinitis with conjunctivitis, atopic dermatitis, adverse food reaction. She was last seen in the office on 12/13/2022 by myself. Discussed the use of AI scribe software for clinical note transcription with the patient, who gave verbal consent to proceed.  She experienced increased sneezing and itchy eyes when she skipped her allergy  medication, with more watery and red eyes back in April. Since starting her prescribed medications, her sneezing has reduced significantly, previously reaching up to fifty times a day.  Her current allergy  management includes Xyzal  (levocetirizine) and Singulair  (montelukast ) as oral maintenance medications, and an as-needed eye drop for itchy, watery, or red eyes. She uses steam treatments for her nose and occasionally applies Vaseline as a remedy. She avoids nasal sprays due to discomfort.  She started Dupixent  in July by her dermatologist. She has received three doses so far and cannot yet tell if it is having effect.  Her previous provide her allergy  symptoms are not as bad this summer and she may be attributing to the Dupixent . She however has avoided outdoor activities like family cookouts to manage her symptoms better.  Regarding her eczema, she experienced burning with Eucrisa, which left marks on her skin.  She experienced nausea when injecting Dupixent  in her abdomen, so she prefers rotating injections in her thighs. She ensures the medication is at room temperature before administration to reduce discomfort.     Review of systems: 10pt ROS negative unless noted above in HPI   Past medical/social/surgical/family history have been reviewed and are unchanged unless specifically indicated below.  No changes  Medication List: Current Outpatient  Medications  Medication Sig Dispense Refill   ALPRAZolam  (XANAX ) 0.25 MG tablet Take 1 tablet (0.25 mg total) by mouth 2 (two) times daily as needed for anxiety. 20 tablet 0   betamethasone  dipropionate 0.05 % cream Apply twice a day for 3 weeks and then stop and take a break for 2 weeks.  Repeat until clear. 45 g 1   buPROPion  ER (WELLBUTRIN  SR) 100 MG 12 hr tablet Take 2 tablets (200 mg total) by mouth 2 (two) times daily. 180 tablet 3   busPIRone  (BUSPAR ) 7.5 MG tablet Take 1 tablet (7.5 mg total) by mouth 2 (two) times daily. 180 tablet 2   diazepam (VALIUM) 5 MG tablet Take 5 mg by mouth at bedtime.     DUPIXENT  300 MG/2ML SOAJ INJECT 2 PENS UNDER THE SKIN ON DAY 1, THEN INJECT 1 PEN ON DAY 15, THEN 1 PEN EVERY 14 DAYS THEREAFTER 4 mL 11   EPINEPHrine  (AUVI-Q ) 0.3 mg/0.3 mL IJ SOAJ injection Inject 0.3 mg into the muscle as needed for anaphylaxis. 2 each 2   gabapentin  (NEURONTIN ) 100 MG capsule Take 1 capsule (100 mg total) by mouth at bedtime. 90 capsule 2   JUNEL  FE 1.5/30 1.5-30 MG-MCG tablet TAKE 1 TABLET BY MOUTH EVERY DAY 28 tablet 11   meloxicam  (MOBIC ) 15 MG tablet Take 1 tablet (15 mg total) by mouth daily. 30 tablet 0   naltrexone  (DEPADE) 50 MG tablet Take 1 tablet (50 mg total) by mouth in the morning and at bedtime. 180 tablet 3   PROTOPIC  0.1 % ointment Apply topically 2 (two) times daily. 100 g 2   tiZANidine  (ZANAFLEX ) 4 MG tablet Take 1 tablet (4 mg  total) by mouth every 6 (six) hours as needed for muscle spasms. 30 tablet 2   traZODone  (DESYREL ) 50 MG tablet TAKE 1/2 TO 1 TABLET(25 TO 50 MG) BY MOUTH AT BEDTIME AS NEEDED FOR SLEEP 30 tablet 0   clobetasol  ointment (TEMOVATE ) 0.05 % Apply 1 Application topically 2 (two) times daily as needed. 30 g 5   levocetirizine (XYZAL ) 5 MG tablet Take 1 tablet (5 mg total) by mouth every evening. 30 tablet 5   montelukast  (SINGULAIR ) 10 MG tablet Take 1 tablet (10 mg total) by mouth at bedtime. 30 tablet 5   triamcinolone  cream  (KENALOG ) 0.1 % Apply 1 Application topically 2 (two) times daily as needed. 45 g 5   No current facility-administered medications for this visit.     Known medication allergies: Allergies  Allergen Reactions   [place] Grass    Cat Dander    Pepto-Bismol [Bismuth Subsalicylate] Hives   Shellfish Allergy     Shrimp (Diagnostic)    Sweet Gum Allergy  Skin Test      Physical examination: Blood pressure 128/82, pulse 89, height 5' 1 (1.549 m), weight 176 lb (79.8 kg), SpO2 98%.  General: Alert, interactive, in no acute distress. HEENT: PERRLA, TMs pearly gray, turbinates non-edematous without discharge, post-pharynx non erythematous. Neck: Supple without lymphadenopathy. Lungs: Clear to auscultation without wheezing, rhonchi or rales. {no increased work of breathing. CV: Normal S1, S2 without murmurs. Abdomen: Nondistended, nontender. Skin: Warm and dry, without lesions or rashes. Extremities:  No clubbing, cyanosis or edema. Neuro:   Grossly intact.  Diagnostics/Labs: None today  Assessment and plan: Allergic rhinitis with conjunctivitis - Continue avoidance measures for grasses, ragweed, weeds, trees, cat, and cockroach - Continue with:  Xyzal  (levocetirizine) 5mg  tablet once daily Singulair  (montelukast ) 10mg  daily at bedtime. Continue steam treatments to help with nasal symptoms  Pataday  (olopatadine ) one drop per eye daily as needed for itchy/watery eyes.  - You can use an extra dose of the antihistamine, if needed, for breakthrough symptoms.  - Consider allergy  shots as a means of long-term control if medication management is not effective enough.  Allergy  shots re-train and reset the immune system to ignore environmental allergens and decrease the resulting immune response to those allergens (sneezing, itchy watery eyes, runny nose, nasal congestion, etc).  Allergy  shots improve symptoms in 75-85% of patients. If interested in this therapy let me know.   Atopic  dermatitis - Bathe and soak for 5-10 minutes in warm water once a day. Pat dry.  Immediately apply the below cream prescribed to flared areas (red, irritated, dry, itchy, patchy, scaly, flaky) only. Wait several minutes and then apply your moisturizer all over.   - Continue eczema therapy recommendations from Dr. Alm, your dermatologist - Continue Dupixent  injections every 2 weeks and rotate your thighs for the injections.   Reviewed and mechanism of action, protocol and side effects today with Dupixent   Adverse food reaction - Food skin testing was positive to shrimp and shellfish mix.  Negative to milk and fish.    Milk is likely more of an intolerance.  - Continue avoidance of shellfish. - Recommend you have access to self-injectable epinephrine  (Epipen  or AuviQ) 0.3mg  in case of allergic reaction - Follow emergency action plan in case of allergic reaction   Allergy : food allergy  is when you have eaten a food, developed an allergic reaction after eating the food and have IgE to the food (positive food testing either by skin testing or blood testing).  Food allergy   could lead to life threatening symptoms  Sensitivity: occurs when you have IgE to a food (positive food testing either by skin testing or blood testing) but is a food you eat without any issues.  This is not an allergy  and we recommend keeping the food in the diet  Intolerance: this is when you have negative testing by either skin testing or blood testing thus not allergic but the food causes symptoms (like belly pain, bloating, diarrhea etc) with ingestion.  These foods should be avoided to prevent symptoms.     Follow-up in 6 months or sooner if needed  I appreciate the opportunity to take part in Makailyn's care. Please do not hesitate to contact me with questions.  Sincerely,   Danita Brain, MD Allergy /Immunology [facility]

## 2023-10-16 DIAGNOSIS — F411 Generalized anxiety disorder: Secondary | ICD-10-CM | POA: Diagnosis not present

## 2023-10-22 ENCOUNTER — Other Ambulatory Visit: Payer: Self-pay | Admitting: *Deleted

## 2023-10-22 ENCOUNTER — Encounter: Payer: Self-pay | Admitting: Allergy

## 2023-10-22 MED ORDER — EPINEPHRINE 0.3 MG/0.3ML IJ SOAJ: 0.3000 mg | INTRAMUSCULAR | 1 refills | Status: AC | PRN

## 2023-10-31 ENCOUNTER — Ambulatory Visit: Admission: EM | Admit: 2023-10-31 | Discharge: 2023-10-31 | Disposition: A | Discharge status: Home / Self Care

## 2023-10-31 ENCOUNTER — Encounter: Payer: Self-pay | Admitting: Emergency Medicine

## 2023-10-31 DIAGNOSIS — L03115 Cellulitis of right lower limb: Secondary | ICD-10-CM

## 2023-10-31 MED ORDER — DOXYCYCLINE HYCLATE 100 MG PO CAPS: 100.0000 mg | ORAL_CAPSULE | Freq: Two times a day (BID) | ORAL | 0 refills | Status: AC

## 2023-10-31 MED ORDER — PREDNISONE 20 MG PO TABS: 40.0000 mg | ORAL_TABLET | Freq: Every day | ORAL | 0 refills | Status: AC

## 2023-10-31 NOTE — ED Provider Notes (Signed)
 [facility]  Note:  This document was prepared using Conservation officer, historic buildings and may include unintentional dictation errors.  MRN: [id number] DOB: 11/26/1985  Subjective:   Destiny Simpson is a 38 y.o. female presenting for multiple insect bites to the right ankle x 2 days.  Patient reports that she noticed increased swelling, redness, itching, pain yesterday.  Patient has been using over-the-counter Magic molecule with no improvement.  Patient reports pain is 8/10.  Denies any fever, purulent drainage, severe warmth, severe redness.  No current facility-administered medications for this encounter.  Current Outpatient Medications:    doxycycline  (VIBRAMYCIN ) 100 MG capsule, Take 1 capsule (100 mg total) by mouth 2 (two) times daily for 5 days., Disp: 10 capsule, Rfl: 0   predniSONE  (DELTASONE ) 20 MG tablet, Take 2 tablets (40 mg total) by mouth daily for 5 days., Disp: 10 tablet, Rfl: 0   ALPRAZolam  (XANAX ) 0.25 MG tablet, Take 1 tablet (0.25 mg total) by mouth 2 (two) times daily as needed for anxiety., Disp: 20 tablet, Rfl: 0   betamethasone  dipropionate 0.05 % cream, Apply twice a day for 3 weeks and then stop and take a break for 2 weeks.  Repeat until clear., Disp: 45 g, Rfl: 1   buPROPion  ER (WELLBUTRIN  SR) 100 MG 12 hr tablet, Take 2 tablets (200 mg total) by mouth 2 (two) times daily., Disp: 180 tablet, Rfl: 3   busPIRone  (BUSPAR ) 7.5 MG tablet, Take 1 tablet (7.5 mg total) by mouth 2 (two) times daily., Disp: 180 tablet, Rfl: 2   clobetasol  ointment (TEMOVATE ) 0.05 %, Apply 1 Application topically 2 (two) times daily as needed., Disp: 30 g, Rfl: 5   diazepam (VALIUM) 5 MG tablet, Take 5 mg by mouth at bedtime., Disp: , Rfl:    DUPIXENT  300 MG/2ML SOAJ, INJECT 2 PENS UNDER THE SKIN ON DAY 1, THEN INJECT 1 PEN ON DAY 15, THEN 1 PEN EVERY 14 DAYS THEREAFTER, Disp: 4 mL, Rfl: 11   EPINEPHrine  (AUVI-Q ) 0.3 mg/0.3 mL IJ SOAJ injection, Inject 0.3 mg into the muscle  as needed for anaphylaxis., Disp: 2 each, Rfl: 2   EPINEPHrine  (EPIPEN  2-PAK) 0.3 mg/0.3 mL IJ SOAJ injection, Inject 0.3 mg into the muscle as needed., Disp: 2 each, Rfl: 1   gabapentin  (NEURONTIN ) 100 MG capsule, Take 1 capsule (100 mg total) by mouth at bedtime., Disp: 90 capsule, Rfl: 2   JUNEL  FE 1.5/30 1.5-30 MG-MCG tablet, TAKE 1 TABLET BY MOUTH EVERY DAY, Disp: 28 tablet, Rfl: 11   levocetirizine (XYZAL ) 5 MG tablet, Take 1 tablet (5 mg total) by mouth every evening., Disp: 30 tablet, Rfl: 5   meloxicam  (MOBIC ) 15 MG tablet, Take 1 tablet (15 mg total) by mouth daily., Disp: 30 tablet, Rfl: 0   montelukast  (SINGULAIR ) 10 MG tablet, Take 1 tablet (10 mg total) by mouth at bedtime., Disp: 30 tablet, Rfl: 5   naltrexone  (DEPADE) 50 MG tablet, Take 1 tablet (50 mg total) by mouth in the morning and at bedtime., Disp: 180 tablet, Rfl: 3   PROTOPIC  0.1 % ointment, Apply topically 2 (two) times daily., Disp: 100 g, Rfl: 2   tiZANidine  (ZANAFLEX ) 4 MG tablet, Take 1 tablet (4 mg total) by mouth every 6 (six) hours as needed for muscle spasms., Disp: 30 tablet, Rfl: 2   traZODone  (DESYREL ) 50 MG tablet, TAKE 1/2 TO 1 TABLET(25 TO 50 MG) BY MOUTH AT BEDTIME AS NEEDED FOR SLEEP, Disp: 30 tablet, Rfl: 0   triamcinolone   cream (KENALOG ) 0.1 %, Apply 1 Application topically 2 (two) times daily as needed., Disp: 45 g, Rfl: 5   Allergies  Allergen Reactions   [place] Grass    Cat Dander    Pepto-Bismol [Bismuth Subsalicylate] Hives   Shellfish Allergy     Shrimp (Diagnostic)    Sweet Gum Allergy  Skin Test     Past Medical History:  Diagnosis Date   Abnormal Pap smear    Allergy     Angio-edema    Anxiety    Asthma    Eczema    Hypertension 09/21/2021   Ovarian cyst    Urticaria      Past Surgical History:  Procedure Laterality Date   ABLATION ON ENDOMETRIOSIS N/A 04/09/2013   Procedure: ABLATION ON ENDOMETRIOSIS;  Surgeon: Truman Corona, MD;  Location: [facility];  Service: Gynecology;   Laterality: N/A;   CESAREAN SECTION  04/08/2013   Laparoscopic   COLPOSCOPY     NO PAST SURGERIES     OVARIAN CYST REMOVAL Bilateral 04/09/2013   Procedure: OVARIAN CYST drainage ;  Surgeon: Truman Corona, MD;  Location: [facility];  Service: Gynecology;  Laterality: Bilateral;    Family History  Problem Relation Age of Onset   Obesity Mother    Diabetes Maternal Aunt    Miscarriages / Stillbirths Maternal Aunt    Early death Maternal Aunt    Breast cancer Paternal Aunt 30   Breast cancer Paternal Aunt 30   Hypertension Maternal Grandmother    Diabetes Maternal Grandmother    Cancer Maternal Grandmother    Miscarriages / Stillbirths Maternal Grandmother    Stroke Maternal Grandmother    Hearing loss Maternal Grandfather    Breast cancer Paternal Grandmother 108   Cancer Paternal Grandmother    Asthma Paternal Grandmother    COPD Paternal Grandmother    Diabetes Paternal Grandmother    Stroke Paternal Grandmother    Hypertension Paternal Grandfather     Social History   Tobacco Use   Smoking status: Never   Smokeless tobacco: Never  Substance Use Topics   Alcohol use: Yes    Alcohol/week: 1.0 standard drink of alcohol    Types: 1 Glasses of wine per week   Drug use: No    ROS Refer to HPI for ROS details.  Objective:   Vitals: BP 139/88 (BP Location: Left Arm)   Pulse 90   Temp 98.6 F (37 C) (Oral)   Resp 18   LMP  (LMP Unknown)   SpO2 98%   Physical Exam Vitals and nursing note reviewed.  Constitutional:      General: She is not in acute distress.    Appearance: Normal appearance. She is not ill-appearing.  HENT:     Head: Normocephalic.  Cardiovascular:     Rate and Rhythm: Normal rate.  Pulmonary:     Effort: Pulmonary effort is normal. No respiratory distress.  Skin:    General: Skin is warm and dry.     Capillary Refill: Capillary refill takes less than 2 seconds.     Findings: Abrasion, erythema and rash present. No abscess or bruising. Rash  is papular.  Neurological:     General: No focal deficit present.     Mental Status: She is alert and oriented to person, place, and time.  Psychiatric:        Mood and Affect: Mood normal.        Behavior: Behavior normal.     Procedures  No results found for this or  any previous visit (from the past 24 hours).  No results found.   Assessment and Plan :     Discharge Instructions       1. Cellulitis of right lower extremity (Primary) - doxycycline  (VIBRAMYCIN ) 100 MG capsule; Take 1 capsule (100 mg total) by mouth 2 (two) times daily for 5 days.  Dispense: 10 capsule; Refill: 0 - predniSONE  (DELTASONE ) 20 MG tablet; Take 2 tablets (40 mg total) by mouth daily for 5 days.  Dispense: 10 tablet; Refill: 0 -Continue to monitor symptoms for any change in severity if there is any escalation of current symptoms or development of new symptoms follow-up in ER for further evaluation and management.      Lennyn Bellanca B Ismaeel Arvelo   Zuly Belkin, [place], [place] 10/31/23 1349

## 2023-10-31 NOTE — Discharge Instructions (Addendum)
  1. Cellulitis of right lower extremity (Primary) - doxycycline  (VIBRAMYCIN ) 100 MG capsule; Take 1 capsule (100 mg total) by mouth 2 (two) times daily for 5 days.  Dispense: 10 capsule; Refill: 0 - predniSONE  (DELTASONE ) 20 MG tablet; Take 2 tablets (40 mg total) by mouth daily for 5 days.  Dispense: 10 tablet; Refill: 0 -Continue to monitor symptoms for any change in severity if there is any escalation of current symptoms or development of new symptoms follow-up in ER for further evaluation and management.

## 2023-10-31 NOTE — ED Triage Notes (Signed)
 Patient reports insect bite to right ankle x 2 days. Patient complains of pain, swelling and itching. Patient has used magic molecule with no improvement. Rates pain 8/10.

## 2023-11-01 ENCOUNTER — Encounter: Payer: Self-pay | Admitting: Family Medicine

## 2023-11-01 ENCOUNTER — Other Ambulatory Visit: Payer: Self-pay | Admitting: Family Medicine

## 2023-11-01 MED ORDER — FLUCONAZOLE 150 MG PO TABS: ORAL_TABLET | ORAL | 0 refills | Status: AC

## 2023-11-05 DIAGNOSIS — F411 Generalized anxiety disorder: Secondary | ICD-10-CM | POA: Diagnosis not present

## 2023-11-07 ENCOUNTER — Encounter: Payer: Self-pay | Admitting: Dermatology

## 2023-12-30 ENCOUNTER — Ambulatory Visit: Admitting: Dermatology

## 2023-12-30 ENCOUNTER — Encounter: Payer: Self-pay | Admitting: Dermatology

## 2023-12-30 VITALS — BP 122/81

## 2023-12-30 DIAGNOSIS — L732 Hidradenitis suppurativa: Secondary | ICD-10-CM

## 2023-12-30 DIAGNOSIS — L209 Atopic dermatitis, unspecified: Secondary | ICD-10-CM | POA: Diagnosis not present

## 2023-12-30 MED ORDER — BETAMETHASONE DIPROPIONATE 0.05 % EX CREA: TOPICAL_CREAM | CUTANEOUS | 1 refills | Status: AC

## 2023-12-30 NOTE — Patient Instructions (Addendum)
 VISIT SUMMARY:  Today, you had a follow-up appointment to review your eczema and hidradenitis suppurativa. Your eczema is well-controlled with Dupixent , and you have not experienced any recent flares. You did have an episode of irritant dermatitis on your foot after a pedicure, which resolved quickly with dexamethasone . Your hidradenitis suppurativa is stable, and you are exploring different deodorants to manage sweating and odor without causing irritation.  YOUR PLAN:  -ATOPIC DERMATITIS (ECZEMA):  Eczema is a condition that makes your skin red, inflamed, and itchy. Your eczema is well-controlled with Dupixent , which has significantly reduced your itch and skin involvement. Continue using Dupixent  as prescribed. Samples of Dupixent  have been provided until your insurance issues are resolved.    -HIDRADENITIS SUPPURATIVA, STAGE 1:  Hidradenitis suppurativa is a chronic skin condition that causes small, painful lumps under the skin. Your condition is stable with no recent flares. Continue washing daily with benzoyl peroxide. Try Dove spray deodorant to prevent irritation, and consider using Certin Dry at night if you need more help with sweating.  INSTRUCTIONS:  Continue with your current treatment plan and follow the recommendations provided. If you experience any new symptoms or have concerns, please schedule a follow-up appointment. Important Information  Due to recent changes in healthcare laws, you may see results of your pathology and/or laboratory studies on MyChart before the doctors have had a chance to review them. We understand that in some cases there may be results that are confusing or concerning to you. Please understand that not all results are received at the same time and often the doctors may need to interpret multiple results in order to provide you with the best plan of care or course of treatment. Therefore, we ask that you please give us  2 business days to thoroughly review all  your results before contacting the office for clarification. Should we see a critical lab result, you will be contacted sooner.   If You Need Anything After Your Visit  If you have any questions or concerns for your doctor, please call our main line at [phone] If no one answers, please leave a voicemail as directed and we will return your call as soon as possible. Messages left after 4 pm will be answered the following business day.   You may also send us  a message via MyChart. We typically respond to MyChart messages within 1-2 business days.  For prescription refills, please ask your pharmacy to contact our office. Our fax number is [phone].  If you have an urgent issue when the clinic is closed that cannot wait until the next business day, you can page your doctor at the number below.    Please note that while we do our best to be available for urgent issues outside of office hours, we are not available 24/7.   If you have an urgent issue and are unable to reach us , you may choose to seek medical care at your doctor's office, retail clinic, urgent care center, or emergency room.  If you have a medical emergency, please immediately call 911 or go to the emergency department. In the event of inclement weather, please call our main line at [phone] for an update on the status of any delays or closures.  Dermatology Medication Tips: Please keep the boxes that topical medications come in in order to help keep track of the instructions about where and how to use these. Pharmacies typically print the medication instructions only on the boxes and not directly on the medication tubes.  If your medication is too expensive, please contact our office at [phone] or send us  a message through MyChart.   We are unable to tell what your co-pay for medications will be in advance as this is different depending on your insurance coverage. However, we may be able to find a substitute  medication at lower cost or fill out paperwork to get insurance to cover a needed medication.   If a prior authorization is required to get your medication covered by your insurance company, please allow us  1-2 business days to complete this process.  Drug prices often vary depending on where the prescription is filled and some pharmacies may offer cheaper prices.  The website www.goodrx.com contains coupons for medications through different pharmacies. The prices here do not account for what the cost may be with help from insurance (it may be cheaper with your insurance), but the website can give you the price if you did not use any insurance.  - You can print the associated coupon and take it with your prescription to the pharmacy.  - You may also stop by our office during regular business hours and pick up a GoodRx coupon card.  - If you need your prescription sent electronically to a different pharmacy, notify our office through [facility] or by phone at [phone]

## 2023-12-30 NOTE — Progress Notes (Signed)
   Follow-Up Visit   Subjective  Destiny Simpson is a 38 y.o. female established patient who presents for FOLLOW UP on the diagnoses listed below:  Patient (and/or pt guardian) consented to the use of AI-assisted tools for note generation.  Patient was last evaluated on 09/02/23.   Atopic Derm: Pt has been managing Sx with Dupixent  for 19mo. Inj teaching done at [facility]. No flares. She has only had to use betamethasone  once since last OV due contact derm from a product during a pedicure 3 weeks ago - but stated area has resolved. Prior to Dupixent  she rated her itch 10/10 and now she is a 0/10.    HS: Hurley stage 1, recommended to wash areas daily with BPO. Patient reports sxs are better- she has had no flares since starting use of BPO. She is still searching for a good deodorant to use that doesn't causes irritation.    Are you nursing, pregnant or trying to conceive? No   The following portions of the chart were reviewed this encounter and updated as appropriate: medications, allergies, medical history  Review of Systems:  No other skin or systemic complaints except as noted in HPI or Assessment and Plan.  Objective  Well appearing patient in no apparent distress; mood and affect are within normal limits.   A focused examination was performed of the following areas:    Relevant exam findings are noted in the Assessment and Plan.    Assessment & Plan   Atopic dermatitis (eczema) Eczema is well-controlled with Dupixent . Recent flare on the foot was due to irritant dermatitis from a new product used during a pedicure, resolved with dexamethasone  in two days. No recent flares under the arms. Dupixent  has significantly reduced itch severity from 10/10 to 0/10 and body surface involvement decreased from over 10% to 0%.  - Continue Dupixent  as prescribed. - Provided samples of Dupixent  until insurance issues are resolved. - Ensure daily use of benzoyl peroxide to prevent clogging  and bacterial growth. - Consider Dove spray deodorant for scent boost and irritation prevention. - Consider Certin Dry for nighttime use to reduce sweating if Vanicream is insufficient.  Hidradenitis suppurativa, Hurley stage 1 Hidradenitis suppurativa is stable with no recent flares. Current management includes benzoyl peroxide washing. She is seeking a deodorant that does not cause irritation. - Continue benzoyl peroxide washing daily. - Try Dove spray deodorant for irritation prevention. - Consider Certin Dry for nighttime use to reduce sweating if needed.    Return in about 6 months (around 06/28/2024) for ATOPIC DERM/ECZEMA, HS.   Documentation: I have reviewed the above documentation for accuracy and completeness, and I agree with the above.  I, Shirron Maranda, CMA, am acting as scribe for [employer], DO.   Delon Lenis, DO

## 2024-01-13 DIAGNOSIS — F411 Generalized anxiety disorder: Secondary | ICD-10-CM | POA: Diagnosis not present

## 2024-01-27 DIAGNOSIS — F411 Generalized anxiety disorder: Secondary | ICD-10-CM | POA: Diagnosis not present

## 2024-02-04 ENCOUNTER — Other Ambulatory Visit: Payer: Self-pay

## 2024-02-04 ENCOUNTER — Encounter: Payer: Self-pay | Admitting: Dermatology

## 2024-02-04 DIAGNOSIS — L209 Atopic dermatitis, unspecified: Secondary | ICD-10-CM

## 2024-02-04 MED ORDER — DUPIXENT 300 MG/2ML ~~LOC~~ SOAJ: SUBCUTANEOUS | 11 refills | Status: DC

## 2024-02-04 MED ORDER — DUPIXENT 300 MG/2ML ~~LOC~~ SOAJ: SUBCUTANEOUS | 11 refills | Status: AC

## 2024-02-11 DIAGNOSIS — F411 Generalized anxiety disorder: Secondary | ICD-10-CM | POA: Diagnosis not present

## 2024-03-20 ENCOUNTER — Telehealth: Payer: Self-pay

## 2024-03-20 NOTE — Telephone Encounter (Signed)
 Atopic dermatitis (eczema) Eczema is well-controlled with Dupixent . Recent flare on the foot was due to irritant dermatitis from a new product used during a pedicure, resolved with dexamethasone  in two days. No recent flares under the arms. Dupixent  has significantly reduced itch severity from 10/10 to 0/10 and body surface involvement decreased from over 10% to 0%.  Well controlled with Dupixent  Therapy  Atopic dermatitis (eczema) is a chronic, relapsing, pruritic condition that can significantly affect quality of life. It is often associated with allergic rhinitis and/or asthma and can require treatment with topical medications, phototherapy, or in severe cases biologic injectable medication (Dupixent ; Adbry) or Oral JAK inhibitors.  Patient has previously tried and failed the following medications: - Prednisolone  Injection 04/2011 - 10/2011: Ineffective - Betamethasone  Cream 0.05%: 10/2011 -10/2023: Ineffective - Zyrtec  5mg PHILLIP 10/2011 - 12/2012: Ineffective - Prednisone  Tablet 20 mg 06/2022 -  02/2023: Ineffective - Singulair  10 mg tablets 06/2022 - 02/2023: Ineffective - Xyzal  5 mg tablet 06/2022 - Current: Very Minimal Improvement in itch control - Triamcinolone  Cream 0.1% 09/2022 - 09/2023: Ineffective - Clobetasol  Ointment 0.05% 09/2022 - Current: Ineffective - Elidel  1% Cream 11/2022 - 09/2023: Ineffective - Protopic  0.1% Ointment 01/2023 - 01/2024: Ineffective - Zoryve  Cream 0.15% 03/2023 - 03/2024: Ineffective - Eucrisa 2% Ointment 04/2023 - 02/2024: Ineffective

## 2024-04-09 ENCOUNTER — Ambulatory Visit: Admitting: Allergy

## 2024-12-30 ENCOUNTER — Ambulatory Visit: Admitting: Dermatology
